# Patient Record
Sex: Female | Born: 1970 | Race: White | Hispanic: No | Marital: Single | Smoking: Never smoker
Health system: Southern US, Community
[De-identification: ages and names within clinical notes are randomized; demographics above are authoritative.]

## PROBLEM LIST (undated history)

## (undated) DIAGNOSIS — K219 Gastro-esophageal reflux disease without esophagitis: Secondary | ICD-10-CM

## (undated) DIAGNOSIS — Z87442 Personal history of urinary calculi: Secondary | ICD-10-CM

## (undated) DIAGNOSIS — E119 Type 2 diabetes mellitus without complications: Secondary | ICD-10-CM

## (undated) HISTORY — PX: EXTRACORPOREAL SHOCK WAVE LITHOTRIPSY: SHX1557

## (undated) HISTORY — DX: Gastro-esophageal reflux disease without esophagitis: K21.9

## (undated) HISTORY — DX: Type 2 diabetes mellitus without complications: E11.9

---

## 2007-08-25 HISTORY — PX: CHOLECYSTECTOMY: SHX55

## 2016-08-24 HISTORY — PX: OTHER SURGICAL HISTORY: SHX169

## 2017-07-22 ENCOUNTER — Other Ambulatory Visit (HOSPITAL_COMMUNITY): Payer: Self-pay | Admitting: General Surgery

## 2017-09-01 ENCOUNTER — Encounter: Payer: BC Managed Care – PPO | Attending: General Surgery | Admitting: Skilled Nursing Facility1

## 2017-09-01 ENCOUNTER — Encounter: Payer: Self-pay | Admitting: Skilled Nursing Facility1

## 2017-09-01 DIAGNOSIS — Z6841 Body Mass Index (BMI) 40.0 and over, adult: Secondary | ICD-10-CM | POA: Diagnosis not present

## 2017-09-01 DIAGNOSIS — E669 Obesity, unspecified: Secondary | ICD-10-CM

## 2017-09-01 DIAGNOSIS — Z713 Dietary counseling and surveillance: Secondary | ICD-10-CM | POA: Diagnosis present

## 2017-09-01 NOTE — Progress Notes (Signed)
Pre-Op Assessment Visit:  Pre-Operative RYGB Surgery  Medical Nutrition Therapy:  Appt start time: 4:51  End time:  5:45  Patient was seen on 09/01/2017 for Pre-Operative Nutrition Assessment. [facility] on 09/01/2017.   Pt states she has cut out soda due to it exacerbating her GERD symptoms.    Start weight at [facility]: 260.2 BMI: 42.00   24 hr Dietary Recall: First Meal: cereal Snack:  Second Meal: sandwich and chips or chicken pie and mashed potatoes and green beans Snack: chips and peanut butter crackers  Third Meal: cereal or meatloaf and mashed potatoes and green bean  Snack: chips and peanut butter crackers  Beverages: water, coffee  Encouraged to engage in 150 minutes of moderate physical activity including cardiovascular and weight baring weekly  Handouts given during visit include:  . Pre-Op Goals . Bariatric Surgery Protein Shakes During the appointment today the following Pre-Op Goals were reviewed with the patient: . Maintain or lose weight as instructed by your surgeon . Make healthy food choices . Begin to limit portion sizes . Limited concentrated sugars and fried foods . Keep fat/sugar in the single digits per serving on             food labels . Practice CHEWING your food  (aim for 30 chews per bite or until applesauce consistency) . Practice not drinking 15 minutes before, during, and 30 minutes after each meal/snack . Avoid all carbonated beverages  . Avoid/limit caffeinated beverages  . Avoid all sugar-sweetened beverages . Consume 3 meals per day; eat every 3-5 hours . Make a list of non-food related activities . Aim for 64-100 ounces of FLUID daily  . Aim for at least 60-80 grams of PROTEIN daily . Look for a liquid protein source that contain ?15 g protein and ?5 g carbohydrate  (ex: shakes, drinks, shots)  -Follow diet recommendations listed below   Energy  and Macronutrient Recomendations: Calories: 1600 Carbohydrate: 180 Protein: 120 Fat: 44  Demonstrated degree of understanding via:  Teach Back  Teaching Method Utilized: Visual Auditory Hands on Barriers to learning/adherence to lifestyle change: none identified  Patient to call the [facility] to enroll in Pre-Op and Post-Op Nutrition Education when surgery date is scheduled.

## 2017-10-05 ENCOUNTER — Encounter: Payer: BC Managed Care – PPO | Attending: General Surgery | Admitting: Registered"

## 2017-10-05 ENCOUNTER — Encounter: Payer: Self-pay | Admitting: Registered"

## 2017-10-05 DIAGNOSIS — E119 Type 2 diabetes mellitus without complications: Secondary | ICD-10-CM

## 2017-10-05 DIAGNOSIS — Z6841 Body Mass Index (BMI) 40.0 and over, adult: Secondary | ICD-10-CM | POA: Insufficient documentation

## 2017-10-05 DIAGNOSIS — Z713 Dietary counseling and surveillance: Secondary | ICD-10-CM | POA: Insufficient documentation

## 2017-10-05 NOTE — Progress Notes (Signed)
RYGB Appt start time: 2:35 end time: 2:56  Assessment: 1st SWL Appointment.   Start Wt at [facility]: 260.2 Wt: 257.6 BMI: 41.58   Pt arrives having lost 2.6 lbs from previous visit.   Pt states she has been trying to make healthy food choices. Pt states she likes sweets (cookies, cakes, etc). Pt states she has practiced not drinking with meals. Pt states she checks her BS every once in a while, reports last A1c was in Dec (6.1). Pt states she has had diabetes for about 1 year.   Per insurance visits, 3 SWL visits.   MEDICATIONS: See list   DIETARY INTAKE:  24-hr recall:  B ( AM): instant oatmeal   Snk ( AM): none  L ( PM): banana sandwich with mayo, low-fat cheetos Snk ( PM): none D ( PM): pork chops, macaroni and cheese with broccoli, baked beans Snk ( PM): pound cake Beverages: water, 1 c coffee  Usual physical activity: walking 30 min   Diet to Follow: 1600 calories 180 g carbohydrates 120 g protein 44 g fat  Preferred Learning Style:   No preference indicated   Learning Readiness:   Ready  Change in progress     Nutritional Diagnosis:  [place]-3.3 Overweight/obesity related to past poor dietary habits and physical inactivity as evidenced by patient w/ planned RYGB surgery following dietary guidelines for continued weight loss.    Intervention:  Nutrition counseling for upcoming Bariatric Surgery.  Goals:  - Aim for 150 minutes of physical activity including cardio and weight bearing every week - Aim to create a schedule for physical activity of at least 30 min, 3 days/week.  - Aim to check blood sugars fasting (80-130) and 2 hours (<180) after meals. Track numbers.  - Continue to not drink 15 minutes before eating, not while eating, and waiting 30 minutes after eating to drink.  - Try to pair carbohydrate sources with protein.   Teaching Method Utilized:  Visual Auditory Hands on  Handouts given during visit include:  Pre-op goals  Protein  Shakes  Vitamin and Mineral Recommendations  Barriers to learning/adherence to lifestyle change: none identified  Demonstrated degree of understanding via:  Teach Back   Monitoring/Evaluation:  Dietary intake, exercise, and body weight in 1 month(s).

## 2017-10-05 NOTE — Patient Instructions (Addendum)
-   Aim to create a schedule for physical activity of at least 30 min, 3 days/week.   - Aim to check blood sugars fasting (80-130) and 2 hours (<180) after meals. Track numbers.   - Continue to not drink 15 minutes before eating, not while eating, and waiting 30 minutes after eating to drink.   - Try to pair carbohydrate sources with protein.

## 2017-11-02 ENCOUNTER — Encounter: Payer: Self-pay | Admitting: Registered"

## 2017-11-02 ENCOUNTER — Encounter: Payer: BC Managed Care – PPO | Attending: General Surgery | Admitting: Registered"

## 2017-11-02 DIAGNOSIS — Z6841 Body Mass Index (BMI) 40.0 and over, adult: Secondary | ICD-10-CM | POA: Diagnosis not present

## 2017-11-02 DIAGNOSIS — Z713 Dietary counseling and surveillance: Secondary | ICD-10-CM | POA: Insufficient documentation

## 2017-11-02 DIAGNOSIS — E119 Type 2 diabetes mellitus without complications: Secondary | ICD-10-CM

## 2017-11-02 NOTE — Progress Notes (Signed)
RYGB Appt start time: 5:00 end time: 5:25  Assessment: 2nd SWL Appointment.   Start Wt at [facility]: 260.2 Wt: 259.1 BMI: 41.82   Pt arrives having gained about 1.5 lbs from previous visit.   Pt states she has been trying to make healthy food choices. Pt states she is still working on not drinking with meals. Pt states she is checking BS more often, 3-4x/week: FBS (130). Pt states she has increased physical activity with treadmill. Pt states she is drinking at least 64 ounces of fluid a day.     Per insurance visits, 3 SWL visits.   MEDICATIONS: See list   DIETARY INTAKE:  24-hr recall:  B ( AM): instant oatmeal   Snk ( AM): none  L ( PM): banana sandwich with mayo, low-fat cheetos Snk ( PM): none D ( PM): pork chops, macaroni and cheese with broccoli, baked beans Snk ( PM): pound cake Beverages: water, 1 c coffee  Usual physical activity: walking on treadmill 10-15 min, 3-4x/week  Diet to Follow: 1600 calories 180 g carbohydrates 120 g protein 44 g fat  Preferred Learning Style:   No preference indicated   Learning Readiness:   Ready  Change in progress     Nutritional Diagnosis:  [place]-3.3 Overweight/obesity related to past poor dietary habits and physical inactivity as evidenced by patient w/ planned RYGB surgery following dietary guidelines for continued weight loss.    Intervention:  Nutrition counseling for upcoming Bariatric Surgery.  Goals:  - Aim for 150 minutes of physical activity including cardio and weight bearing every week - Aim to check blood sugars fasting (80-130) and 2 hours (<180) after meals. Track numbers.  - Continue to not drink 15 minutes before eating, not while eating, and waiting 30 minutes after eating to drink.  - Aim to chew at least 30 times per bite or to applesauce consistency.  - Look into attending support group.    Teaching Method Utilized:  Visual Auditory Hands on  Handouts given during visit include:  none  Barriers  to learning/adherence to lifestyle change: none identified  Demonstrated degree of understanding via:  Teach Back   Monitoring/Evaluation:  Dietary intake, exercise, and body weight in 1 month(s).

## 2017-11-02 NOTE — Patient Instructions (Addendum)
-   Aim to check blood sugars fasting (80-130) and 2 hours (<180) after meals. Track numbers.   - Continue to not drink 15 minutes before eating, not while eating, and waiting 30 minutes after eating to drink.   - Aim to chew at least 30 times per bite or to applesauce consistency.   - Look into attending support group.

## 2017-12-02 ENCOUNTER — Encounter: Payer: BC Managed Care – PPO | Attending: General Surgery | Admitting: Registered"

## 2017-12-02 ENCOUNTER — Encounter: Payer: Self-pay | Admitting: Registered"

## 2017-12-02 DIAGNOSIS — E119 Type 2 diabetes mellitus without complications: Secondary | ICD-10-CM

## 2017-12-02 DIAGNOSIS — Z713 Dietary counseling and surveillance: Secondary | ICD-10-CM | POA: Diagnosis present

## 2017-12-02 DIAGNOSIS — Z6841 Body Mass Index (BMI) 40.0 and over, adult: Secondary | ICD-10-CM | POA: Insufficient documentation

## 2017-12-02 NOTE — Patient Instructions (Addendum)
-   Aim to check blood sugars fasting (80-130) and 2 hours (<180) after meals. Track numbers.   - Aim to increase as physical activity with treadmill 2x/week and walking dog outside 2x/wk at least 15 min each.   - Check out support on Thurs, 4/18 from 6-7 pm. See handout.   - Aim to decrease caffeine intake.

## 2017-12-02 NOTE — Progress Notes (Signed)
RYGB Appt start time: 5:00 end time: 5:25  Assessment: 3rd SWL Appointment.   Start Wt at [facility]: 260.2 Wt: 252.8 BMI: 40.80   Pt arrives having lost 6.3 lbs from previous visit.   Pt states she has been trying to make healthy food choices. Pt states she is doing well with not drinking 15 minutes before eating, not while eating, and waiting 30 minutes after eating to drink. Pt states she is checking BS 3x/week: FBS (125-140). Pt states she keeps forgetting to check BS during the day. Pt states chewing 30 times per bite has improved for her. Pt states she can tolerate Premier Protein: chocolate and bananas and cream flavors.   Pt states she has increased physical activity with treadmill. Pt states she is drinking at least 64 ounces of fluid a day.     Per insurance visits, 3 SWL visits.   MEDICATIONS: See list   DIETARY INTAKE:  24-hr recall:  B ( AM): cereal    Snk ( AM): none  L ( PM): banana sandwich with peanut butter, low-fat cheetos, yogurt Snk ( PM): none D ( PM): pork chop, green beans, and rice/broccoli mix Snk ( PM): none Beverages: water, 1 c coffee, skim milk  Usual physical activity: walking on treadmill 15 min, 2x/week  Diet to Follow: 1600 calories 180 g carbohydrates 120 g protein 44 g fat  Preferred Learning Style:   No preference indicated   Learning Readiness:   Ready  Change in progress     Nutritional Diagnosis:  [place]-3.3 Overweight/obesity related to past poor dietary habits and physical inactivity as evidenced by patient w/ planned RYGB surgery following dietary guidelines for continued weight loss.    Intervention:  Nutrition counseling for upcoming Bariatric Surgery.  Goals:  - Aim for 150 minutes of physical activity including cardio and weight bearing every week - Aim to check blood sugars fasting (80-130) and 2 hours (<180) after meals. Track numbers.  - Aim to increase as physical activity with treadmill 2x/week and walking dog  outside 2x/wk at least 15 min each.  - Check out support on Thurs, 4/18 from 6-7 pm. See handout.  - Aim to decrease caffeine intake.     Teaching Method Utilized:  Visual Auditory Hands on  Handouts given during visit include:  Bariatric Support Group  Barriers to learning/adherence to lifestyle change: none identified  Demonstrated degree of understanding via:  Teach Back   Monitoring/Evaluation:  Dietary intake, exercise, and body weight prn.

## 2017-12-06 ENCOUNTER — Other Ambulatory Visit (HOSPITAL_COMMUNITY)
Admission: RE | Admit: 2017-12-06 | Discharge: 2017-12-06 | Disposition: A | Discharge status: Home / Self Care | Payer: BC Managed Care – PPO | Source: Other Acute Inpatient Hospital | Attending: General Surgery | Admitting: General Surgery

## 2017-12-06 ENCOUNTER — Ambulatory Visit (HOSPITAL_COMMUNITY)
Admission: RE | Admit: 2017-12-06 | Discharge: 2017-12-06 | Disposition: A | Discharge status: Home / Self Care | Payer: BC Managed Care – PPO | Source: Ambulatory Visit | Attending: General Surgery | Admitting: General Surgery

## 2017-12-06 ENCOUNTER — Encounter (HOSPITAL_COMMUNITY): Payer: Self-pay | Admitting: Radiology

## 2017-12-06 LAB — LIPID PANEL
Cholesterol: 179 mg/dL (ref 0–200)
HDL: 51 mg/dL (ref >40)
LDL CALC: 111 mg/dL — AB (ref 0–99)
Total CHOL/HDL Ratio: 3.5 RATIO
Triglycerides: 86 mg/dL (ref <150)
VLDL: 17 mg/dL (ref 0–40)

## 2017-12-06 LAB — TSH: TSH: 1.198 u[IU]/mL (ref 0.350–4.500)

## 2017-12-06 LAB — HCG, SERUM, QUALITATIVE: PREG SERUM: POSITIVE — AB

## 2017-12-06 LAB — COMPREHENSIVE METABOLIC PANEL
ALBUMIN: 4.1 g/dL (ref 3.5–5.0)
ALK PHOS: 96 U/L (ref 38–126)
ALT: 50 U/L (ref 14–54)
AST: 41 U/L (ref 15–41)
Anion gap: 12 (ref 5–15)
BUN: 9 mg/dL (ref 6–20)
CHLORIDE: 103 mmol/L (ref 101–111)
CO2: 24 mmol/L (ref 22–32)
CREATININE: 0.7 mg/dL (ref 0.44–1.00)
Calcium: 9.4 mg/dL (ref 8.9–10.3)
GFR calc non Af Amer: >60 mL/min (ref >60)
Glucose, Bld: 120 mg/dL — ABNORMAL HIGH (ref 65–99)
Potassium: 4 mmol/L (ref 3.5–5.1)
Sodium: 139 mmol/L (ref 135–145)
Total Bilirubin: 0.5 mg/dL (ref 0.3–1.2)
Total Protein: 8.3 g/dL — ABNORMAL HIGH (ref 6.5–8.1)

## 2017-12-06 LAB — FOLATE: FOLATE: 18.4 ng/mL (ref >5.9)

## 2017-12-06 LAB — HEMOGLOBIN A1C
HEMOGLOBIN A1C: 6.3 % — AB (ref 4.8–5.6)
Mean Plasma Glucose: 134.11 mg/dL

## 2017-12-06 LAB — IRON: IRON: 62 ug/dL (ref 28–170)

## 2017-12-06 LAB — VITAMIN B12: VITAMIN B 12: 541 pg/mL (ref 180–914)

## 2017-12-07 LAB — VITAMIN D 25 HYDROXY (VIT D DEFICIENCY, FRACTURES): Vit D, 25-Hydroxy: 29.9 ng/mL — ABNORMAL LOW (ref 30.0–100.0)

## 2017-12-08 LAB — VITAMIN B1: Vitamin B1 (Thiamine): 197.1 nmol/L (ref 66.5–200.0)

## 2017-12-10 LAB — VITAMIN D 1,25 DIHYDROXY
VITAMIN D3 1, 25 (OH): 59 pg/mL
Vitamin D 1, 25 (OH)2 Total: 60 pg/mL

## 2018-01-03 ENCOUNTER — Encounter: Payer: BC Managed Care – PPO | Attending: General Surgery | Admitting: Skilled Nursing Facility1

## 2018-01-03 DIAGNOSIS — Z6841 Body Mass Index (BMI) 40.0 and over, adult: Secondary | ICD-10-CM | POA: Diagnosis not present

## 2018-01-03 DIAGNOSIS — E669 Obesity, unspecified: Secondary | ICD-10-CM

## 2018-01-03 DIAGNOSIS — Z713 Dietary counseling and surveillance: Secondary | ICD-10-CM | POA: Diagnosis not present

## 2018-01-04 ENCOUNTER — Encounter: Payer: Self-pay | Admitting: Skilled Nursing Facility1

## 2018-01-04 NOTE — Progress Notes (Signed)
Pre-Operative Nutrition Class:  Appt start time: 8727   End time:  1830.  Patient was seen on 01/03/2018 for Pre-Operative Bariatric Surgery Education [facility].   Surgery date:  Surgery type: RYGB Start weight at [facility]: 260.2 Weight today: 258.6  Samples given per MNT protocol. Patient educated on appropriate usage: Bariatric Advantage Multivitamin Lot #[id number] Exp:07/19  Bariatric Advantage Calcium  Lot #[id number] Exp:sep-03-2018  Renee Pain Protein Shake Lot #[id number] Exp:oct-19-19    The following the learning objectives were met by the patient during this course:  Identify Pre-Op Dietary Goals and will begin 2 weeks pre-operatively  Identify appropriate sources of fluids and proteins   State protein recommendations and appropriate sources pre and post-operatively  Identify Post-Operative Dietary Goals and will follow for 2 weeks post-operatively  Identify appropriate multivitamin and calcium sources  Describe the need for physical activity post-operatively and will follow MD recommendations  State when to call healthcare provider regarding medication questions or post-operative complications  Handouts given during class include:  Pre-Op Bariatric Surgery Diet Handout  Protein Shake Handout  Post-Op Bariatric Surgery Nutrition Handout  [facility] Program Information Flyer  Support Group Information Flyer  [facility] Bariatric Supplements Price List  [facility]: [facility].

## 2018-02-02 ENCOUNTER — Ambulatory Visit: Payer: Self-pay | Admitting: General Surgery

## 2018-02-09 NOTE — Patient Instructions (Signed)
Chelsea Mitchell  02/09/2018   Your procedure is scheduled on: 02-16-18   Report to [facility] Main  Entrance  Report to admitting at 530 AM    Call this number if you have problems the morning of surgery [phone]   Remember: Do not eat food  :After 600 PM NIGHT BEFORE SURGERY DRINK 2 SHAKES AT 1000 PM NIGHT BEFORE SURGERY.   MORNING OF SURGERY DRINK:  LAST SHAKE BEFORE YOU LEAVE HOME, DRINK ALL OF THE SHAKE AT ONE TIME.   NO SOLID FOOD AFTER 600 PM THE NIGHT BEFORE YOUR SURGERY. YOU MAY DRINK CLEAR FLUIDS. THE SHAKE YOU DRINK BEFORE YOU LEAVE HOME WILL BE THE LAST FLUIDS YOU DRINK BEFORE SURGERY.DRINK LAST SHAKE AT 430 AM.   PAIN IS EXPECTED AFTER SURGERY AND WILL NOT BE COMPLETELY ELIMINATED. AMBULATION AND TYLENOL WILL HELP REDUCE INCISIONAL AND GAS PAIN. MOVEMENT IS KEY!  YOU ARE EXPECTED TO BE OUT OF BED WITHIN 4 HOURS OF ADMISSION TO YOUR PATIENT ROOM.  SITTING IN THE RECLINER THROUGHOUT THE DAY IS IMPORTANT FOR DRINKING FLUIDS AND MOVING GAS THROUGHOUT THE GI TRACT.  COMPRESSION STOCKINGS SHOULD BE WORN [facility] UNLESS YOU ARE WALKING.   INCENTIVE SPIROMETER SHOULD BE USED EVERY HOUR WHILE AWAKE TO DECREASE POST-OPERATIVE COMPLICATIONS SUCH AS PNEUMONIA.  WHEN DISCHARGED HOME, IT IS IMPORTANT TO CONTINUE TO WALK EVERY HOUR AND USE THE INCENTIVE SPIROMETER EVERY HOUR.     CLEAR LIQUID DIET   Foods Allowed                                                                     Foods Excluded  Coffee and tea, regular and decaf                             liquids that you cannot  Plain Jell-O in any flavor                                             see through such as: Fruit ices (not with fruit pulp)                                     milk, soups, orange juice  Iced Popsicles                                    All solid food Carbonated beverages, regular and diet                                    Cranberry, grape and apple  juices Sports drinks like Gatorade Lightly seasoned clear broth or consume(fat free) Sugar, honey syrup  Sample Menu Breakfast  Lunch                                     Supper Cranberry juice                    Beef broth                            Chicken broth Jell-O                                     Grape juice                           Apple juice Coffee or tea                        Jell-O                                      Popsicle                                                Coffee or tea                        Coffee or tea  _____________________________________________________________________           Take these medicines the morning of surgery with A SIP OF WATER: PANTAPRAZOLE (PROTONIX) DO NOT TAKE ANY DIABETIC MEDICATIONS DAY OF YOUR SURGERY                 YOU MAY TAKE YOUR METFORMIN DAY BEFORE SURGERY 02-14-18                  DO NOT TAKE YOUR METFORMIN DAY OF SURGERY 02-15-18!                You may not have any metal on your body including hair pins and              piercings  Do not wear jewelry, make-up, lotions, powders or perfumes, deodorant             Do not wear nail polish.  Do not shave  48 hours prior to surgery.              Men may shave face and neck.   Do not bring valuables to the hospital. [place] IS NOT             RESPONSIBLE   FOR VALUABLES.  Contacts, dentures or bridgework may not be worn into surgery.  Leave suitcase in the car. After surgery it may be brought to your room.                 Please read over the following fact sheets you were given: _____________________________________________________________________  [facility] - Preparing for Surgery Before surgery, you can play an important role.  Because skin is not sterile, your skin needs to be as free of germs as possible.  You can reduce the number of germs on your skin by washing with CHG (chlorahexidine gluconate) soap before surgery.  CHG  is an antiseptic cleaner which kills germs and bonds with the skin to continue killing germs even after washing. Please DO NOT use if you have an allergy to CHG or antibacterial soaps.  If your skin becomes reddened/irritated stop using the CHG and inform your nurse when you arrive at Short Stay. Do not shave (including legs and underarms) for at least 48 hours prior to the first CHG shower.  You may shave your face/neck. Please follow these instructions carefully:  1.  Shower with CHG Soap the night before surgery and the  morning of Surgery.  2.  If you choose to wash your hair, wash your hair first as usual with your  normal  shampoo.  3.  After you shampoo, rinse your hair and body thoroughly to remove the  shampoo.                           4.  Use CHG as you would any other liquid soap.  You can apply chg directly  to the skin and wash                       Gently with a scrungie or clean washcloth.  5.  Apply the CHG Soap to your body ONLY FROM THE NECK DOWN.   Do not use on face/ open                           Wound or open sores. Avoid contact with eyes, ears mouth and genitals (private parts).                       Wash face,  Genitals (private parts) with your normal soap.             6.  Wash thoroughly, paying special attention to the area where your surgery  will be performed.  7.  Thoroughly rinse your body with warm water from the neck down.  8.  DO NOT shower/wash with your normal soap after using and rinsing off  the CHG Soap.                9.  Pat yourself dry with a clean towel.            10.  Wear clean pajamas.            11.  Place clean sheets on your bed the night of your first shower and do not  sleep with pets. Day of Surgery : Do not apply any lotions/deodorants the morning of surgery.  Please wear clean clothes to the hospital/surgery center.  FAILURE TO FOLLOW THESE INSTRUCTIONS MAY RESULT IN THE CANCELLATION OF YOUR SURGERY PATIENT  SIGNATURE_________________________________  NURSE SIGNATURE__________________________________  ________________________________________________________________________   Chelsea Mitchell  An incentive spirometer is a tool that can help keep your lungs clear and active. This tool measures how well you are filling your lungs with each breath. Taking long deep breaths may help reverse or decrease the chance of developing breathing (pulmonary) problems (especially infection) following:  A long period of time when you are unable to move or be active. BEFORE THE PROCEDURE   If the spirometer includes an indicator to show your best effort, your nurse or respiratory therapist will  set it to a desired goal.  If possible, sit up straight or lean slightly forward. Try not to slouch.  Hold the incentive spirometer in an upright position. INSTRUCTIONS FOR USE  1. Sit on the edge of your bed if possible, or sit up as far as you can in bed or on a chair. 2. Hold the incentive spirometer in an upright position. 3. Breathe out normally. 4. Place the mouthpiece in your mouth and seal your lips tightly around it. 5. Breathe in slowly and as deeply as possible, raising the piston or the ball toward the top of the column. 6. Hold your breath for 3-5 seconds or for as long as possible. Allow the piston or ball to fall to the bottom of the column. 7. Remove the mouthpiece from your mouth and breathe out normally. 8. Rest for a few seconds and repeat Steps 1 through 7 at least 10 times every 1-2 hours when you are awake. Take your time and take a few normal breaths between deep breaths. 9. The spirometer may include an indicator to show your best effort. Use the indicator as a goal to work toward during each repetition. 10. After each set of 10 deep breaths, practice coughing to be sure your lungs are clear. If you have an incision (the cut made at the time of surgery), support your incision when coughing  by placing a pillow or rolled up towels firmly against it. Once you are able to get out of bed, walk around indoors and cough well. You may stop using the incentive spirometer when instructed by your caregiver.  RISKS AND COMPLICATIONS  Take your time so you do not get dizzy or light-headed.  If you are in pain, you may need to take or ask for pain medication before doing incentive spirometry. It is harder to take a deep breath if you are having pain. AFTER USE  Rest and breathe slowly and easily.  It can be helpful to keep track of a log of your progress. Your caregiver can provide you with a simple table to help with this. If you are using the spirometer at home, follow these instructions: SEEK MEDICAL CARE IF:   You are having difficultly using the spirometer.  You have trouble using the spirometer as often as instructed.  Your pain medication is not giving enough relief while using the spirometer.  You develop fever of 100.5 F (38.1 C) or higher. SEEK IMMEDIATE MEDICAL CARE IF:   You cough up bloody sputum that had not been present before.  You develop fever of 102 F (38.9 C) or greater.  You develop worsening pain at or near the incision site. MAKE SURE YOU:   Understand these instructions.  Will watch your condition.  Will get help right away if you are not doing well or get worse. Document Released: 12/21/2006 Document Revised: 11/02/2011 Document Reviewed: 02/21/2007 [employer] Patient Information 2014 Exit[employer], [place].   ________________________________________________________________________  WHAT IS A BLOOD TRANSFUSION? Blood Transfusion Information  A transfusion is the replacement of blood or some of its parts. Blood is made up of multiple cells which provide different functions.  Red blood cells carry oxygen and are used for blood loss replacement.  White blood cells fight against infection.  Platelets control bleeding.  Plasma helps clot  blood.  Other blood products are available for specialized needs, such as hemophilia or other clotting disorders. BEFORE THE TRANSFUSION  Who gives blood for transfusions?   Healthy volunteers who are fully  evaluated to make sure their blood is safe. This is blood bank blood. Transfusion therapy is the safest it has ever been in the practice of medicine. Before blood is taken from a donor, a complete history is taken to make sure that person has no history of diseases nor engages in risky social behavior (examples are intravenous drug use or sexual activity with multiple partners). The donor's travel history is screened to minimize risk of transmitting infections, such as malaria. The donated blood is tested for signs of infectious diseases, such as HIV and hepatitis. The blood is then tested to be sure it is compatible with you in order to minimize the chance of a transfusion reaction. If you or a relative donates blood, this is often done in anticipation of surgery and is not appropriate for emergency situations. It takes many days to process the donated blood. RISKS AND COMPLICATIONS Although transfusion therapy is very safe and saves many lives, the main dangers of transfusion include:   Getting an infectious disease.  Developing a transfusion reaction. This is an allergic reaction to something in the blood you were given. Every precaution is taken to prevent this. The decision to have a blood transfusion has been considered carefully by your caregiver before blood is given. Blood is not given unless the benefits outweigh the risks. AFTER THE TRANSFUSION  Right after receiving a blood transfusion, you will usually feel much better and more energetic. This is especially true if your red blood cells have gotten low (anemic). The transfusion raises the level of the red blood cells which carry oxygen, and this usually causes an energy increase.  The nurse administering the transfusion will monitor  you carefully for complications. HOME CARE INSTRUCTIONS  No special instructions are needed after a transfusion. You may find your energy is better. Speak with your caregiver about any limitations on activity for underlying diseases you may have. SEEK MEDICAL CARE IF:   Your condition is not improving after your transfusion.  You develop redness or irritation at the intravenous (IV) site. SEEK IMMEDIATE MEDICAL CARE IF:  Any of the following symptoms occur over the next 12 hours:  Shaking chills.  You have a temperature by mouth above 102 F (38.9 C), not controlled by medicine.  Chest, back, or muscle pain.  People around you feel you are not acting correctly or are confused.  Shortness of breath or difficulty breathing.  Dizziness and fainting.  You get a rash or develop hives.  You have a decrease in urine output.  Your urine turns a dark color or changes to pink, red, or brown. Any of the following symptoms occur over the next 10 days:  You have a temperature by mouth above 102 F (38.9 C), not controlled by medicine.  Shortness of breath.  Weakness after normal activity.  The white part of the eye turns yellow (jaundice).  You have a decrease in the amount of urine or are urinating less often.  Your urine turns a dark color or changes to pink, red, or brown. Document Released: 08/07/2000 Document Revised: 11/02/2011 Document Reviewed: 03/26/2008 [employer] Patient Information 2014 [place], [place].  _______________________________________________________________________

## 2018-02-10 ENCOUNTER — Encounter (HOSPITAL_COMMUNITY)
Admission: RE | Admit: 2018-02-10 | Discharge: 2018-02-10 | Disposition: A | Discharge status: Home / Self Care | Payer: BC Managed Care – PPO | Source: Ambulatory Visit | Attending: General Surgery | Admitting: General Surgery

## 2018-02-10 ENCOUNTER — Other Ambulatory Visit: Payer: Self-pay

## 2018-02-10 ENCOUNTER — Encounter (HOSPITAL_COMMUNITY): Payer: Self-pay

## 2018-02-10 DIAGNOSIS — R9431 Abnormal electrocardiogram [ECG] [EKG]: Secondary | ICD-10-CM | POA: Diagnosis not present

## 2018-02-10 DIAGNOSIS — Z01810 Encounter for preprocedural cardiovascular examination: Secondary | ICD-10-CM | POA: Diagnosis present

## 2018-02-10 HISTORY — DX: Morbid (severe) obesity due to excess calories: E66.01

## 2018-02-10 HISTORY — DX: Personal history of urinary calculi: Z87.442

## 2018-02-10 LAB — CBC WITH DIFFERENTIAL/PLATELET
BASOS PCT: 1 %
Basophils Absolute: 0.1 10*3/uL (ref 0.0–0.1)
EOS ABS: 0.2 10*3/uL (ref 0.0–0.7)
Eosinophils Relative: 3 %
HEMATOCRIT: 41.1 % (ref 36.0–46.0)
HEMOGLOBIN: 13.7 g/dL (ref 12.0–15.0)
Lymphocytes Relative: 39 %
Lymphs Abs: 3 10*3/uL (ref 0.7–4.0)
MCH: 29.8 pg (ref 26.0–34.0)
MCHC: 33.3 g/dL (ref 30.0–36.0)
MCV: 89.3 fL (ref 78.0–100.0)
Monocytes Absolute: 0.8 10*3/uL (ref 0.1–1.0)
Monocytes Relative: 10 %
NEUTROS ABS: 3.7 10*3/uL (ref 1.7–7.7)
NEUTROS PCT: 47 %
Platelets: 249 10*3/uL (ref 150–400)
RBC: 4.6 MIL/uL (ref 3.87–5.11)
RDW: 13.3 % (ref 11.5–15.5)
WBC: 7.8 10*3/uL (ref 4.0–10.5)

## 2018-02-10 LAB — COMPREHENSIVE METABOLIC PANEL
ALBUMIN: 3.8 g/dL (ref 3.5–5.0)
ALT: 63 U/L — AB (ref 14–54)
ANION GAP: 8 (ref 5–15)
AST: 57 U/L — AB (ref 15–41)
Alkaline Phosphatase: 92 U/L (ref 38–126)
BILIRUBIN TOTAL: 0.2 mg/dL — AB (ref 0.3–1.2)
BUN: 10 mg/dL (ref 6–20)
CO2: 24 mmol/L (ref 22–32)
CREATININE: 0.56 mg/dL (ref 0.44–1.00)
Calcium: 9.4 mg/dL (ref 8.9–10.3)
Chloride: 109 mmol/L (ref 101–111)
GFR calc Af Amer: >60 mL/min (ref >60)
GFR calc non Af Amer: >60 mL/min (ref >60)
Glucose, Bld: 162 mg/dL — ABNORMAL HIGH (ref 65–99)
Potassium: 4.3 mmol/L (ref 3.5–5.1)
SODIUM: 141 mmol/L (ref 135–145)
TOTAL PROTEIN: 7.3 g/dL (ref 6.5–8.1)

## 2018-02-10 LAB — GLUCOSE, CAPILLARY: Glucose-Capillary: 210 mg/dL — ABNORMAL HIGH (ref 65–99)

## 2018-02-10 LAB — ABO/RH: ABO/RH(D): O POS

## 2018-02-10 LAB — HEMOGLOBIN A1C
Hgb A1c MFr Bld: 7.2 % — ABNORMAL HIGH (ref 4.8–5.6)
MEAN PLASMA GLUCOSE: 159.94 mg/dL

## 2018-02-10 NOTE — Progress Notes (Signed)
Chart left with ihechi ewareum for follow of ehg and hemaglobin A1C.

## 2018-02-10 NOTE — Progress Notes (Signed)
Abnormal ekg [facility] 09-23-17 on chart will repeat with pre op today

## 2018-02-14 MED ORDER — BUPIVACAINE LIPOSOME 1.3 % IJ SUSP
20.0000 mL | INTRAMUSCULAR | Status: DC
  Filled 2018-02-14: qty 20

## 2018-02-14 NOTE — Anesthesia Preprocedure Evaluation (Addendum)
Anesthesia Evaluation  Patient identified by MRN, date of birth, ID band Patient awake    Reviewed: Allergy & Precautions, H&P , NPO status , Patient's Chart, lab work & pertinent test results  Airway Mallampati: I  TM Distance: >3 FB Neck ROM: Full    Dental no notable dental hx. (+) Teeth Intact, Dental Advisory Given   Pulmonary neg pulmonary ROS,    Pulmonary exam normal breath sounds clear to auscultation       Cardiovascular Exercise Tolerance: Good negative cardio ROS   Rhythm:Regular Rate:Normal     Neuro/Psych negative neurological ROS  negative psych ROS   GI/Hepatic Neg liver ROS, GERD  ,  Endo/Other  diabetes, Type 2, Oral Hypoglycemic AgentsMorbid obesity  Renal/GU negative Renal ROS  negative genitourinary   Musculoskeletal   Abdominal   Peds  Hematology negative hematology ROS (+)   Anesthesia Other Findings   Reproductive/Obstetrics negative OB ROS                            Anesthesia Physical Anesthesia Plan  ASA: III  Anesthesia Plan: General   Post-op Pain Management:    Induction: Intravenous  PONV Risk Score and Plan: 4 or greater and Ondansetron, Dexamethasone, Midazolam and Scopolamine patch - Pre-op  Airway Management Planned: Oral ETT  Additional Equipment:   Intra-op Plan:   Post-operative Plan: Extubation in OR  Informed Consent: I have reviewed the patients History and Physical, chart, labs and discussed the procedure including the risks, benefits and alternatives for the proposed anesthesia with the patient or authorized representative who has indicated his/her understanding and acceptance.   Dental advisory given  Plan Discussed with: CRNA  Anesthesia Plan Comments:         Anesthesia Quick Evaluation

## 2018-02-15 ENCOUNTER — Other Ambulatory Visit: Payer: Self-pay

## 2018-02-15 ENCOUNTER — Inpatient Hospital Stay (HOSPITAL_COMMUNITY): Payer: BC Managed Care – PPO | Admitting: Anesthesiology

## 2018-02-15 ENCOUNTER — Inpatient Hospital Stay (HOSPITAL_COMMUNITY)
Admission: RE | Admit: 2018-02-15 | Discharge: 2018-02-16 | DRG: 621 | Disposition: A | Discharge status: Home / Self Care | Payer: BC Managed Care – PPO | Attending: General Surgery | Admitting: General Surgery

## 2018-02-15 ENCOUNTER — Encounter (HOSPITAL_COMMUNITY): Payer: Self-pay | Admitting: General Surgery

## 2018-02-15 ENCOUNTER — Encounter (HOSPITAL_COMMUNITY)
Admission: RE | Disposition: A | Discharge status: Home / Self Care | Payer: Self-pay | Source: Home / Self Care | Attending: General Surgery

## 2018-02-15 DIAGNOSIS — E119 Type 2 diabetes mellitus without complications: Secondary | ICD-10-CM | POA: Diagnosis present

## 2018-02-15 DIAGNOSIS — K219 Gastro-esophageal reflux disease without esophagitis: Secondary | ICD-10-CM | POA: Diagnosis present

## 2018-02-15 DIAGNOSIS — Z6841 Body Mass Index (BMI) 40.0 and over, adult: Secondary | ICD-10-CM

## 2018-02-15 DIAGNOSIS — Z9049 Acquired absence of other specified parts of digestive tract: Secondary | ICD-10-CM

## 2018-02-15 DIAGNOSIS — K760 Fatty (change of) liver, not elsewhere classified: Secondary | ICD-10-CM | POA: Diagnosis present

## 2018-02-15 DIAGNOSIS — G8929 Other chronic pain: Secondary | ICD-10-CM | POA: Diagnosis present

## 2018-02-15 DIAGNOSIS — Z8711 Personal history of peptic ulcer disease: Secondary | ICD-10-CM

## 2018-02-15 DIAGNOSIS — E1169 Type 2 diabetes mellitus with other specified complication: Secondary | ICD-10-CM | POA: Diagnosis present

## 2018-02-15 DIAGNOSIS — E669 Obesity, unspecified: Secondary | ICD-10-CM

## 2018-02-15 DIAGNOSIS — Z9884 Bariatric surgery status: Secondary | ICD-10-CM

## 2018-02-15 HISTORY — PX: UPPER GI ENDOSCOPY: SHX6162

## 2018-02-15 HISTORY — PX: GASTRIC ROUX-EN-Y: SHX5262

## 2018-02-15 LAB — TYPE AND SCREEN
ABO/RH(D): O POS
Antibody Screen: NEGATIVE

## 2018-02-15 LAB — HEMOGLOBIN AND HEMATOCRIT, BLOOD
HEMATOCRIT: 40.9 % (ref 36.0–46.0)
Hemoglobin: 13.5 g/dL (ref 12.0–15.0)

## 2018-02-15 LAB — GLUCOSE, CAPILLARY
Glucose-Capillary: 148 mg/dL — ABNORMAL HIGH (ref 70–99)
Glucose-Capillary: 166 mg/dL — ABNORMAL HIGH (ref 70–99)
Glucose-Capillary: 189 mg/dL — ABNORMAL HIGH (ref 70–99)
Glucose-Capillary: 211 mg/dL — ABNORMAL HIGH (ref 70–99)
Glucose-Capillary: 239 mg/dL — ABNORMAL HIGH (ref 70–99)

## 2018-02-15 LAB — PREGNANCY, URINE: Preg Test, Ur: NEGATIVE

## 2018-02-15 SURGERY — LAPAROSCOPIC ROUX-EN-Y GASTRIC BYPASS WITH UPPER ENDOSCOPY
Anesthesia: General | Site: Abdomen

## 2018-02-15 MED ORDER — ONDANSETRON HCL 4 MG/2ML IJ SOLN: 4.0000 mg | Freq: Four times a day (QID) | INTRAMUSCULAR | Status: DC | PRN

## 2018-02-15 MED ORDER — PHENYLEPHRINE 40 MCG/ML (10ML) SYRINGE FOR IV PUSH (FOR BLOOD PRESSURE SUPPORT)
PREFILLED_SYRINGE | INTRAVENOUS | Status: AC
  Filled 2018-02-15: qty 10

## 2018-02-15 MED ORDER — KETAMINE HCL 10 MG/ML IJ SOLN
INTRAMUSCULAR | Status: DC | PRN
  Administered 2018-02-15: 30 mg via INTRAVENOUS
  Administered 2018-02-15: 20 mg via INTRAVENOUS

## 2018-02-15 MED ORDER — INSULIN ASPART 100 UNIT/ML ~~LOC~~ SOLN
0.0000 [IU] | SUBCUTANEOUS | Status: DC
  Administered 2018-02-15: 3 [IU] via SUBCUTANEOUS
  Administered 2018-02-15: 2 [IU] via SUBCUTANEOUS
  Administered 2018-02-15: 3 [IU] via SUBCUTANEOUS
  Administered 2018-02-16: 2 [IU] via SUBCUTANEOUS
  Administered 2018-02-16: 3 [IU] via SUBCUTANEOUS
  Administered 2018-02-16: 2 [IU] via SUBCUTANEOUS

## 2018-02-15 MED ORDER — GABAPENTIN 300 MG PO CAPS
300.0000 mg | ORAL_CAPSULE | ORAL | Status: AC
  Administered 2018-02-15: 300 mg via ORAL
  Filled 2018-02-15: qty 1

## 2018-02-15 MED ORDER — OXYCODONE HCL 5 MG/5ML PO SOLN
5.0000 mg | ORAL | Status: DC | PRN
  Administered 2018-02-15 – 2018-02-16 (×3): 5 mg via ORAL
  Filled 2018-02-15 (×3): qty 5

## 2018-02-15 MED ORDER — ONDANSETRON HCL 4 MG/2ML IJ SOLN
INTRAMUSCULAR | Status: DC | PRN
  Administered 2018-02-15: 4 mg via INTRAVENOUS

## 2018-02-15 MED ORDER — DIPHENHYDRAMINE HCL 50 MG/ML IJ SOLN: 12.5000 mg | Freq: Three times a day (TID) | INTRAMUSCULAR | Status: DC | PRN

## 2018-02-15 MED ORDER — ACETAMINOPHEN 500 MG PO TABS
1000.0000 mg | ORAL_TABLET | ORAL | Status: AC
  Administered 2018-02-15: 1000 mg via ORAL
  Filled 2018-02-15: qty 2

## 2018-02-15 MED ORDER — SUCCINYLCHOLINE CHLORIDE 200 MG/10ML IV SOSY
PREFILLED_SYRINGE | INTRAVENOUS | Status: DC | PRN
  Administered 2018-02-15: 120 mg via INTRAVENOUS

## 2018-02-15 MED ORDER — MORPHINE SULFATE (PF) 2 MG/ML IV SOLN
1.0000 mg | INTRAVENOUS | Status: DC | PRN
  Administered 2018-02-15: 1 mg via INTRAVENOUS
  Filled 2018-02-15: qty 1

## 2018-02-15 MED ORDER — DEXAMETHASONE SODIUM PHOSPHATE 4 MG/ML IJ SOLN
4.0000 mg | INTRAMUSCULAR | Status: AC
  Administered 2018-02-15: 4 mg via INTRAVENOUS

## 2018-02-15 MED ORDER — EVICEL 5 ML EX KIT
PACK | Freq: Once | CUTANEOUS | Status: DC
  Filled 2018-02-15: qty 1

## 2018-02-15 MED ORDER — APREPITANT 40 MG PO CAPS
40.0000 mg | ORAL_CAPSULE | ORAL | Status: AC
  Administered 2018-02-15: 40 mg via ORAL
  Filled 2018-02-15: qty 1

## 2018-02-15 MED ORDER — SCOPOLAMINE 1 MG/3DAYS TD PT72
1.0000 | MEDICATED_PATCH | TRANSDERMAL | Status: DC
  Administered 2018-02-15: 1.5 mg via TRANSDERMAL
  Filled 2018-02-15: qty 1

## 2018-02-15 MED ORDER — FAMOTIDINE IN NACL 20-0.9 MG/50ML-% IV SOLN
20.0000 mg | Freq: Two times a day (BID) | INTRAVENOUS | Status: DC
  Administered 2018-02-15 – 2018-02-16 (×3): 20 mg via INTRAVENOUS
  Filled 2018-02-15 (×3): qty 50

## 2018-02-15 MED ORDER — BUPIVACAINE LIPOSOME 1.3 % IJ SUSP: 20.0000 mL | Freq: Once | INTRAMUSCULAR | Status: DC

## 2018-02-15 MED ORDER — PROPOFOL 10 MG/ML IV BOLUS
INTRAVENOUS | Status: AC
  Filled 2018-02-15: qty 20

## 2018-02-15 MED ORDER — LIDOCAINE 2% (20 MG/ML) 5 ML SYRINGE
INTRAMUSCULAR | Status: DC | PRN
  Administered 2018-02-15: 100 mg via INTRAVENOUS

## 2018-02-15 MED ORDER — INSULIN ASPART 100 UNIT/ML ~~LOC~~ SOLN
5.0000 [IU] | Freq: Once | SUBCUTANEOUS | Status: AC
  Administered 2018-02-15: 5 [IU] via SUBCUTANEOUS

## 2018-02-15 MED ORDER — PHENYLEPHRINE 40 MCG/ML (10ML) SYRINGE FOR IV PUSH (FOR BLOOD PRESSURE SUPPORT)
PREFILLED_SYRINGE | INTRAVENOUS | Status: DC | PRN
  Administered 2018-02-15 (×2): 80 ug via INTRAVENOUS

## 2018-02-15 MED ORDER — PROMETHAZINE HCL 25 MG/ML IJ SOLN: 12.5000 mg | Freq: Four times a day (QID) | INTRAMUSCULAR | Status: DC | PRN

## 2018-02-15 MED ORDER — SODIUM CHLORIDE 0.9 % IJ SOLN
INTRAMUSCULAR | Status: AC
Start: 2018-02-15 — End: ?
  Filled 2018-02-15: qty 50

## 2018-02-15 MED ORDER — ROCURONIUM BROMIDE 10 MG/ML (PF) SYRINGE
PREFILLED_SYRINGE | INTRAVENOUS | Status: DC | PRN
  Administered 2018-02-15: 10 mg via INTRAVENOUS
  Administered 2018-02-15: 20 mg via INTRAVENOUS
  Administered 2018-02-15: 50 mg via INTRAVENOUS

## 2018-02-15 MED ORDER — INSULIN ASPART 100 UNIT/ML ~~LOC~~ SOLN
SUBCUTANEOUS | Status: AC
  Administered 2018-02-15: 5 [IU] via SUBCUTANEOUS
  Filled 2018-02-15: qty 1

## 2018-02-15 MED ORDER — SODIUM CHLORIDE 0.9 % IR SOLN
Status: DC | PRN
  Administered 2018-02-15: 1000 mL

## 2018-02-15 MED ORDER — CHLORHEXIDINE GLUCONATE 4 % EX LIQD: 60.0000 mL | Freq: Once | CUTANEOUS | Status: DC

## 2018-02-15 MED ORDER — SUGAMMADEX SODIUM 500 MG/5ML IV SOLN
INTRAVENOUS | Status: DC | PRN
  Administered 2018-02-15: 300 mg via INTRAVENOUS

## 2018-02-15 MED ORDER — ACETAMINOPHEN 160 MG/5ML PO SOLN
650.0000 mg | Freq: Four times a day (QID) | ORAL | Status: DC
  Administered 2018-02-15 – 2018-02-16 (×4): 650 mg via ORAL
  Filled 2018-02-15 (×5): qty 20.3

## 2018-02-15 MED ORDER — SIMETHICONE 80 MG PO CHEW
80.0000 mg | CHEWABLE_TABLET | Freq: Four times a day (QID) | ORAL | Status: DC | PRN
  Administered 2018-02-15: 80 mg via ORAL
  Filled 2018-02-15: qty 1

## 2018-02-15 MED ORDER — GABAPENTIN 100 MG PO CAPS
200.0000 mg | ORAL_CAPSULE | Freq: Two times a day (BID) | ORAL | Status: DC
  Administered 2018-02-15 – 2018-02-16 (×2): 200 mg via ORAL
  Filled 2018-02-15 (×3): qty 2

## 2018-02-15 MED ORDER — GLYCOPYRROLATE 0.2 MG/ML IV SOSY
PREFILLED_SYRINGE | INTRAVENOUS | Status: AC
  Filled 2018-02-15: qty 3

## 2018-02-15 MED ORDER — HYDROMORPHONE HCL 1 MG/ML IJ SOLN
0.2500 mg | INTRAMUSCULAR | Status: DC | PRN
  Administered 2018-02-15 (×2): 0.25 mg via INTRAVENOUS

## 2018-02-15 MED ORDER — MIDAZOLAM HCL 5 MG/5ML IJ SOLN
INTRAMUSCULAR | Status: DC | PRN
  Administered 2018-02-15: 2 mg via INTRAVENOUS

## 2018-02-15 MED ORDER — LACTATED RINGERS IV SOLN
INTRAVENOUS | Status: DC
  Administered 2018-02-15 (×2): via INTRAVENOUS

## 2018-02-15 MED ORDER — FENTANYL CITRATE (PF) 100 MCG/2ML IJ SOLN
INTRAMUSCULAR | Status: AC
  Filled 2018-02-15: qty 2

## 2018-02-15 MED ORDER — LIDOCAINE HCL 2 % IJ SOLN
INTRAMUSCULAR | Status: AC
  Filled 2018-02-15: qty 20

## 2018-02-15 MED ORDER — CEFOTETAN DISODIUM 2 G IJ SOLR
2.0000 g | INTRAMUSCULAR | Status: AC
  Administered 2018-02-15: 2 g via INTRAVENOUS
  Filled 2018-02-15: qty 2

## 2018-02-15 MED ORDER — SODIUM CHLORIDE 0.9 % IJ SOLN
INTRAMUSCULAR | Status: DC | PRN
  Administered 2018-02-15: 50 mL via INTRAVENOUS

## 2018-02-15 MED ORDER — PREMIER PROTEIN SHAKE
2.0000 [oz_av] | ORAL | Status: DC
  Administered 2018-02-16 (×3): 2 [oz_av] via ORAL

## 2018-02-15 MED ORDER — LIDOCAINE 20MG/ML (2%) 15 ML SYRINGE OPTIME
INTRAMUSCULAR | Status: DC | PRN
  Administered 2018-02-15: 1.5 mg/kg/h via INTRAVENOUS

## 2018-02-15 MED ORDER — GLYCOPYRROLATE PF 0.2 MG/ML IJ SOSY
PREFILLED_SYRINGE | INTRAMUSCULAR | Status: DC | PRN
  Administered 2018-02-15: 0.4 mg via INTRAVENOUS

## 2018-02-15 MED ORDER — POTASSIUM CHLORIDE IN NACL 20-0.45 MEQ/L-% IV SOLN
INTRAVENOUS | Status: DC
  Administered 2018-02-15: 12:00:00 via INTRAVENOUS
  Administered 2018-02-15 – 2018-02-16 (×2): 1000 mL via INTRAVENOUS
  Filled 2018-02-15 (×5): qty 1000

## 2018-02-15 MED ORDER — HEPARIN SODIUM (PORCINE) 5000 UNIT/ML IJ SOLN
5000.0000 [IU] | INTRAMUSCULAR | Status: AC
  Administered 2018-02-15: 5000 [IU] via SUBCUTANEOUS
  Filled 2018-02-15: qty 1

## 2018-02-15 MED ORDER — LACTATED RINGERS IR SOLN
Status: DC | PRN
  Administered 2018-02-15: 1000 mL

## 2018-02-15 MED ORDER — PROPOFOL 10 MG/ML IV BOLUS
INTRAVENOUS | Status: DC | PRN
  Administered 2018-02-15: 200 mg via INTRAVENOUS

## 2018-02-15 MED ORDER — EPHEDRINE 5 MG/ML INJ
INTRAVENOUS | Status: AC
  Filled 2018-02-15: qty 10

## 2018-02-15 MED ORDER — EPHEDRINE SULFATE-NACL 50-0.9 MG/10ML-% IV SOSY
PREFILLED_SYRINGE | INTRAVENOUS | Status: DC | PRN
  Administered 2018-02-15: 10 mg via INTRAVENOUS

## 2018-02-15 MED ORDER — DEXAMETHASONE SODIUM PHOSPHATE 10 MG/ML IJ SOLN: INTRAMUSCULAR | Status: DC | PRN

## 2018-02-15 MED ORDER — KETAMINE HCL 10 MG/ML IJ SOLN
INTRAMUSCULAR | Status: AC
  Filled 2018-02-15: qty 1

## 2018-02-15 MED ORDER — ENOXAPARIN SODIUM 30 MG/0.3ML ~~LOC~~ SOLN
30.0000 mg | Freq: Two times a day (BID) | SUBCUTANEOUS | Status: DC
  Administered 2018-02-15 – 2018-02-16 (×2): 30 mg via SUBCUTANEOUS
  Filled 2018-02-15 (×2): qty 0.3

## 2018-02-15 MED ORDER — MIDAZOLAM HCL 2 MG/2ML IJ SOLN
INTRAMUSCULAR | Status: AC
  Filled 2018-02-15: qty 2

## 2018-02-15 MED ORDER — SUGAMMADEX SODIUM 500 MG/5ML IV SOLN
INTRAVENOUS | Status: AC
  Filled 2018-02-15: qty 5

## 2018-02-15 MED ORDER — FENTANYL CITRATE (PF) 100 MCG/2ML IJ SOLN
INTRAMUSCULAR | Status: DC | PRN
  Administered 2018-02-15 (×3): 50 ug via INTRAVENOUS

## 2018-02-15 MED ORDER — HYDROMORPHONE HCL 1 MG/ML IJ SOLN
INTRAMUSCULAR | Status: AC
  Administered 2018-02-15: 0.25 mg via INTRAVENOUS
  Filled 2018-02-15: qty 1

## 2018-02-15 MED ORDER — BUPIVACAINE LIPOSOME 1.3 % IJ SUSP
INTRAMUSCULAR | Status: DC | PRN
  Administered 2018-02-15: 20 mL

## 2018-02-15 MED ORDER — EVICEL 5 ML EX KIT
PACK | CUTANEOUS | Status: DC | PRN
  Administered 2018-02-15: 5 mL

## 2018-02-15 SURGICAL SUPPLY — 68 items
APPLICATOR COTTON TIP 6IN STRL (MISCELLANEOUS) IMPLANT
APPLIER CLIP ROT 13.4 12 LRG (CLIP)
BANDAGE ADH SHEER 1  50/CT (GAUZE/BANDAGES/DRESSINGS) ×24 IMPLANT
BENZOIN TINCTURE PRP APPL 2/3 (GAUZE/BANDAGES/DRESSINGS) ×4 IMPLANT
BLADE SURG SZ11 CARB STEEL (BLADE) ×4 IMPLANT
CABLE HIGH FREQUENCY MONO STRZ (ELECTRODE) IMPLANT
CHLORAPREP W/TINT 26ML (MISCELLANEOUS) ×8 IMPLANT
CLIP APPLIE ROT 13.4 12 LRG (CLIP) IMPLANT
CLIP SUT LAPRA TY ABSORB (SUTURE) ×8 IMPLANT
CLOSURE WOUND 1/2 X4 (GAUZE/BANDAGES/DRESSINGS) ×1
CUTTER FLEX LINEAR 45M (STAPLE) IMPLANT
DEVICE SUT QUICK LOAD TK 5 (STAPLE) IMPLANT
DEVICE SUT TI-KNOT TK 5X26 (MISCELLANEOUS) IMPLANT
DEVICE SUTURE ENDOST 10MM (ENDOMECHANICALS) ×4 IMPLANT
DEVICE TI KNOT TK5 (MISCELLANEOUS)
DRAIN PENROSE 18X1/4 LTX STRL (WOUND CARE) ×4 IMPLANT
ELECT REM PT RETURN 15FT ADLT (MISCELLANEOUS) ×4 IMPLANT
GAUZE 4X4 16PLY RFD (DISPOSABLE) ×4 IMPLANT
GAUZE SPONGE 4X4 12PLY STRL (GAUZE/BANDAGES/DRESSINGS) IMPLANT
GLOVE BIO SURGEON STRL SZ7.5 (GLOVE) IMPLANT
GLOVE INDICATOR 8.0 STRL GRN (GLOVE) ×4 IMPLANT
GOWN STRL REUS W/TWL XL LVL3 (GOWN DISPOSABLE) ×16 IMPLANT
HOVERMATT SINGLE USE (MISCELLANEOUS) ×4 IMPLANT
KIT BASIN OR (CUSTOM PROCEDURE TRAY) ×4 IMPLANT
KIT GASTRIC LAVAGE 34FR ADT (SET/KITS/TRAYS/PACK) ×4 IMPLANT
LUBRICANT JELLY K Y 4OZ (MISCELLANEOUS) ×4 IMPLANT
MARKER SKIN DUAL TIP RULER LAB (MISCELLANEOUS) ×4 IMPLANT
NEEDLE SPNL 22GX3.5 QUINCKE BK (NEEDLE) ×4 IMPLANT
PACK CARDIOVASCULAR III (CUSTOM PROCEDURE TRAY) ×4 IMPLANT
QUICK LOAD TK 5 (STAPLE)
RELOAD 45 VASCULAR/THIN (ENDOMECHANICALS) IMPLANT
RELOAD ENDO STITCH 2.0 (ENDOMECHANICALS) ×16
RELOAD STAPLE TA45 3.5 REG BLU (ENDOMECHANICALS) IMPLANT
RELOAD STAPLER BLUE 60MM (STAPLE) ×6 IMPLANT
RELOAD STAPLER GOLD 60MM (STAPLE) ×2 IMPLANT
RELOAD STAPLER WHITE 60MM (STAPLE) ×4 IMPLANT
SCISSORS LAP 5X45 EPIX DISP (ENDOMECHANICALS) ×4 IMPLANT
SET IRRIG TUBING LAPAROSCOPIC (IRRIGATION / IRRIGATOR) ×4 IMPLANT
SHEARS HARMONIC ACE PLUS 45CM (MISCELLANEOUS) ×4 IMPLANT
SLEEVE XCEL OPT CAN 5 100 (ENDOMECHANICALS) ×4 IMPLANT
SOLUTION ANTI FOG 6CC (MISCELLANEOUS) ×4 IMPLANT
STAPLER ECHELON BIOABSB 60 FLE (MISCELLANEOUS) IMPLANT
STAPLER ECHELON LONG 60 440 (INSTRUMENTS) ×4 IMPLANT
STAPLER RELOAD BLUE 60MM (STAPLE) ×12
STAPLER RELOAD GOLD 60MM (STAPLE) ×4
STAPLER RELOAD WHITE 60MM (STAPLE) ×8
STRIP CLOSURE SKIN 1/2X4 (GAUZE/BANDAGES/DRESSINGS) ×3 IMPLANT
SUT MNCRL AB 4-0 PS2 18 (SUTURE) ×4 IMPLANT
SUT RELOAD ENDO STITCH 2 48X1 (ENDOMECHANICALS) ×8
SUT RELOAD ENDO STITCH 2.0 (ENDOMECHANICALS) ×8
SUT SURGIDAC NAB ES-9 0 48 120 (SUTURE) IMPLANT
SUT VIC AB 2-0 SH 27 (SUTURE) ×2
SUT VIC AB 2-0 SH 27X BRD (SUTURE) ×2 IMPLANT
SUTURE RELOAD END STTCH 2 48X1 (ENDOMECHANICALS) ×8 IMPLANT
SUTURE RELOAD ENDO STITCH 2.0 (ENDOMECHANICALS) ×8 IMPLANT
SYR 10ML ECCENTRIC (SYRINGE) ×4 IMPLANT
SYR 20CC LL (SYRINGE) ×8 IMPLANT
TIP RIGID 35CM EVICEL (HEMOSTASIS) ×4 IMPLANT
TOWEL OR 17X26 10 PK STRL BLUE (TOWEL DISPOSABLE) ×4 IMPLANT
TOWEL OR NON WOVEN STRL DISP B (DISPOSABLE) ×4 IMPLANT
TRAY FOLEY MTR SLVR 16FR STAT (SET/KITS/TRAYS/PACK) ×4 IMPLANT
TROCAR BLADELESS OPT 5 100 (ENDOMECHANICALS) ×4 IMPLANT
TROCAR UNIVERSAL OPT 12M 100M (ENDOMECHANICALS) ×12 IMPLANT
TROCAR XCEL 12X100 BLDLESS (ENDOMECHANICALS) ×4 IMPLANT
TUBING CONNECTING 10 (TUBING) ×6 IMPLANT
TUBING CONNECTING 10' (TUBING) ×2
TUBING ENDO SMARTCAP PENTAX (MISCELLANEOUS) ×4 IMPLANT
TUBING INSUF HEATED (TUBING) ×4 IMPLANT

## 2018-02-15 NOTE — Anesthesia Procedure Notes (Signed)
Procedure Name: Intubation Date/Time: 02/15/2018 7:38 AM Performed by: Lind Covert, CRNA Pre-anesthesia Checklist: Patient identified, Emergency Drugs available, Suction available, Patient being monitored and Timeout performed Patient Re-evaluated:Patient Re-evaluated prior to induction Oxygen Delivery Method: Circle system utilized Preoxygenation: Pre-oxygenation with 100% oxygen Induction Type: IV induction Laryngoscope Size: Mac and 4 Grade View: Grade I Tube type: Oral Tube size: 7.5 mm Number of attempts: 1 Airway Equipment and Method: Stylet Placement Confirmation: ETT inserted through vocal cords under direct vision,  positive ETCO2 and breath sounds checked- equal and bilateral Secured at: 21 cm Tube secured with: Tape Dental Injury: Teeth and Oropharynx as per pre-operative assessment

## 2018-02-15 NOTE — Discharge Instructions (Signed)
° ° ° °GASTRIC BYPASS/SLEEVE ° Home Care Instructions ° ° These instructions are to help you care for yourself when you go home. ° °Call: If you have any problems. °• Call [phone] and ask for the surgeon on call °• If you need immediate help, come to the ER at [facility].  °• Tell the ER staff that you are a new post-op gastric bypass or gastric sleeve patient °  °Signs and symptoms to report: • Severe vomiting or nausea °o If you cannot keep down clear liquids for longer than 1 day, call your surgeon  °• Abdominal pain that does not get better after taking your pain medication °• Fever over 100.4° F with chills °• Heart beating over 100 beats a minute °• Shortness of breath at rest °• Chest pain °•  Redness, swelling, drainage, or foul odor at incision (surgical) sites °•  If your incisions open or pull apart °• Swelling or pain in calf (lower leg) °• Diarrhea (Loose bowel movements that happen often), frequent watery, uncontrolled bowel movements °• Constipation, (no bowel movements for 3 days) if this happens: Pick one °o Milk of Magnesia, 2 tablespoons by mouth, 3 times a day for 2 days if needed °o Stop taking Milk of Magnesia once you have a bowel movement °o Call your doctor if constipation continues °Or °o Miralax  (instead of Milk of Magnesia) following the label instructions °o Stop taking Miralax once you have a bowel movement °o Call your doctor if constipation continues °• Anything you think is not normal °  °Normal side effects after surgery: • Unable to sleep at night or unable to focus °• Irritability or moody °• Being tearful (crying) or depressed °These are common complaints, possibly related to your anesthesia medications that put you to sleep, stress of surgery, and change in lifestyle.  This usually goes away a few weeks after surgery.  If these feelings continue, call your primary care doctor. °  °Wound Care: You may have surgical glue, steri-strips, or staples over your incisions after  surgery °• Surgical glue:  Looks like a clear film over your incisions and will wear off a little at a time °• Steri-strips: Strips of tape over your incisions. You may notice a yellowish color on the skin under the steri-strips. This is used to make the   steri-strips stick better. Do not pull the steri-strips off - let them fall off °• Staples: Staples may be removed before you leave the hospital °o If you go home with staples, call [facility], [phone] at for an appointment with your surgeon’s nurse to have staples removed 10 days after surgery. °• Showering: You may shower two (2) days after your surgery unless your surgeon tells you differently °o Wash gently around incisions with warm soapy water, rinse well, and gently pat dry  °o No tub baths until staples are removed, steri-strips fall off or glue is gone.  °  °Medications: • Medications should be liquid or crushed if larger than the size of a dime °• Extended release pills (medication that release a little bit at a time through the day) should NOT be crushed or cut. (examples include XL, ER, DR, SR) °• Depending on the size and number of medications you take, you may need to space (take a few throughout the day)/change the time you take your medications so that you do not over-fill your pouch (smaller stomach) °• Make sure you follow-up with your primary care doctor to   make medication changes needed during rapid weight loss and life-style changes °• If you have diabetes, follow up with the doctor that orders your diabetes medication(s) within one week after surgery and check your blood sugar regularly. °• Do not drive while taking prescription pain medication  °• It is ok to take Tylenol by the bottle instructions with your pain medicine or instead of your pain medicine as needed.  DO NOT TAKE NSAIDS (EXAMPLES OF NSAIDS:  IBUPROFREN/ NAPROXEN)  °Diet:                    First 2 Weeks ° You will see the dietician t about two (2) weeks  after your surgery. The dietician will increase the types of foods you can eat if you are handling liquids well: °• If you have severe vomiting or nausea and cannot keep down clear liquids lasting longer than 1 day, call your surgeon @ ([phone]) °Protein Shake °• Drink at least 2 ounces of shake 5-6 times per day °• Each serving of protein shakes (usually 8 - 12 ounces) should have: °o 15 grams of protein  °o And no more than 5 grams of carbohydrate  °• Goal for protein each day: °o Men = 80 grams per day °o Women = 60 grams per day °• Protein powder may be added to fluids such as non-fat milk or Lactaid milk or unsweetened Soy/Almond milk (limit to 35 grams added protein powder per serving) ° °Hydration °• Slowly increase the amount of water and other clear liquids as tolerated (See Acceptable Fluids) °• Slowly increase the amount of protein shake as tolerated  °•  Sip fluids slowly and throughout the day.  Do not use straws. °• May use sugar substitutes in small amounts (no more than 6 - 8 packets per day; i.e. Splenda) ° °Fluid Goal °• The first goal is to drink at least 8 ounces of protein shake/drink per day (or as directed by the nutritionist); some examples of protein shakes are Syntrax Nectar, Adkins Advantage, EAS Edge HP, and Unjury. See handout from pre-op Bariatric Education Class: °o Slowly increase the amount of protein shake you drink as tolerated °o You may find it easier to slowly sip shakes throughout the day °o It is important to get your proteins in first °• Your fluid goal is to drink 64 - 100 ounces of fluid daily °o It may take a few weeks to build up to this °• 32 oz (or more) should be clear liquids  °And  °• 32 oz (or more) should be full liquids (see below for examples) °• Liquids should not contain sugar, caffeine, or carbonation ° °Clear Liquids: °• Water or Sugar-free flavored water (i.e. Fruit H2O, Propel) °• Decaffeinated coffee or tea (sugar-free) °• Crystal Lite, Wyler’s Lite,  Minute Maid Lite °• Sugar-free Jell-O °• Bouillon or broth °• Sugar-free Popsicle:   *Less than 20 calories each; Limit 1 per day ° °Full Liquids: °Protein Shakes/Drinks + 2 choices per day of other full liquids °• Full liquids must be: °o No More Than 15 grams of Carbs per serving  °o No More Than 3 grams of Fat per serving °• Strained low-fat cream soup (except Cream of Potato or Tomato) °• Non-Fat milk °• Fat-free Lactaid Milk °• Unsweetened Soy Or Unsweetened Almond Milk °• Low Sugar yogurt (Dannon Lite & Fit, Greek yogurt; Oikos Triple Zero; Chobani Simply 100; Yoplait 100 calorie Greek - No Fruit on the Bottom) ° °  °Vitamins   and Minerals • Start 1 day after surgery unless otherwise directed by your surgeon °• 2 Chewable Bariatric Specific Multivitamin / Multimineral Supplement with iron (Example: Bariatric Advantage Multi EA) °• Chewable Calcium with Vitamin D-3 °(Example: 3 Chewable Calcium Plus 600 with Vitamin D-3) °o Take 500 mg three (3) times a day for a total of 1500 mg each day °o Do not take all 3 doses of calcium at one time as it may cause constipation, and you can only absorb 500 mg  at a time  °o Do not mix multivitamins containing iron with calcium supplements; take 2 hours apart °• Menstruating women and those with a history of anemia (a blood disease that causes weakness) may need extra iron °o Talk with your doctor to see if you need more iron °• Do not stop taking or change any vitamins or minerals until you talk to your dietitian or surgeon °• Your Dietitian and/or surgeon must approve all vitamin and mineral supplements °  °Activity and Exercise: Limit your physical activity as instructed by your doctor.  It is important to continue walking at home.  During this time, use these guidelines: °• Do not lift anything greater than ten (10) pounds for at least two (2) weeks °• Do not go back to work or drive until your surgeon says you can °• You may have sex when you feel comfortable  °o It is  VERY important for female patients to use a reliable birth control method; fertility often increases after surgery  °o All hormonal birth control will be ineffective for 30 days after surgery due to medications given during surgery a barrier method must be used. °o Do not get pregnant for at least 18 months °• Start exercising as soon as your doctor tells you that you can °o Make sure your doctor approves any physical activity °• Start with a simple walking program °• Walk 5-15 minutes each day, 7 days per week.  °• Slowly increase until you are walking 30-45 minutes per day °Consider joining our BELT program. [phone] or email [email or web address] °  °Special Instructions Things to remember: °• Use your CPAP when sleeping if this applies to you ° °• [facility] has two free Bariatric Surgery Support Groups that meet monthly °o The 3rd Thursday of each month, 6 pm, [facility]  °o The 2nd Friday of each month, 11:45 am in the private dining room in the basement of  °• It is very important to keep all follow up appointments with your surgeon, dietitian, primary care physician, and behavioral health practitioner °• Routine follow up schedule with your surgeon include appointments at 2-3 weeks, 6-8 weeks, 6 months, and 1 year at a minimum.  Your surgeon may request to see you more often.   °o After the first year, please follow up with your bariatric surgeon and dietitian at least once a year in order to maintain best weight loss results °[facility]: [phone] °[facility]: [phone] °Bariatric Nurse Coordinator: [phone] °  °   Reviewed and Endorsed  °by [place] Patient Education Committee, June, 2016 °Edits Approved: Aug, 2018 ° ° ° °

## 2018-02-15 NOTE — Op Note (Signed)
Irineo AxonStaci H Varas [id number] 10-Dec-1970. 02/15/2018  Preoperative diagnosis:    Severe obesity (BMI 41)    Diabetes mellitus type 2 in obese    GERD (gastroesophageal reflux disease)   Fatty liver  Postoperative  diagnosis:  1. same  Surgical procedure: Laparoscopic Roux-en-Y gastric bypass (ante-colic, ante-gastric); upper endoscopy  Surgeon: Atilano InaEric M Jadalee Westcott, M.D. FACS  Asst.: Glenna FellowsBenjamin Hoxworth MD FACS  Anesthesia: General plus exparel/marcaine mix  Complications: None   EBL: Minimal   Drains: None   Disposition: PACU in good condition   Indications for procedure: 47 y.o. yo female with morbid obesity who has been unsuccessful at sustained weight loss. The patient's comorbidities are listed above. We discussed the risk and benefits of surgery including but not limited to anesthesia risk, bleeding, infection, blood clot formation, anastomotic leak, anastomotic stricture, ulcer formation, death, respiratory complications, intestinal blockage, internal hernia, gallstone formation, vitamin and nutritional deficiencies, injury to surrounding structures, failure to lose weight and mood changes.   Description of procedure: Patient is brought to the operating room and general anesthesia induced. The patient had received preoperative broad-spectrum IV antibiotics and subcutaneous heparin. The abdomen was widely sterilely prepped with Chloraprep and draped. Patient timeout was performed and correct patient and procedure confirmed. Access was obtained with a 12 mm Optiview trocar in the left upper quadrant and pneumoperitoneum established without difficulty. Under direct vision 12 mm trocars were placed laterally in the right upper quadrant, right upper quadrant midclavicular line, and to the left and above the umbilicus for the camera port. A 5 mm trocar was placed laterally in the left upper quadrant. Some omental adhesions to the anterior abdominal wall in the midline were taken down.   Exparel/marcaine mix was infiltrated in bilateral lateral abdominal walls as a TAP block.  The omentum was brought into the upper abdomen and the transverse mesocolon elevated and the ligament of Treitz clearly identified. A 40 cm biliopancreatic limb was then carefully measured from the ligament of Treitz. The small intestine was divided at this point with a single firing of the white load linear stapler. A Penrose drain was sutured to the end of the Roux-en-Y limb for later identification. A 100 cm Roux-en-Y limb was then carefully measured. At this point a side-to-side anastomosis was created between the Roux limb and the end of the biliopancreatic limb. This was accomplished with a single firing of the 60 mm white load linear stapler. The common enterotomy was closed with a running 2-0 Vicryl begun at either end of the enterotomy and tied centrally. Eviceal tissue sealant was placed over the anastomosis. The mesenteric defect was then closed with running 2-0 silk. The omentum was then divided with the harmonic scalpel up towards the transverse colon to allow mobility of the Roux limb toward the gastric pouch. The patient was then placed in steep reversed Trendelenburg. Through a 5 mm subxiphoid site the [facility] and the left lobe of the liver elevated with excellent exposure of the upper stomach and hiatus. The angle of Hiss was then mobilized with the harmonic scalpel. A 4 cm gastric pouch was then carefully measured along the lesser curve of the stomach. Dissection was carried along the lesser curve at this point with the Harmonic scalpel working carefully back toward the lesser sac at right angles to the lesser curve. The free lesser sac was then entered. After being sure all tubes were removed from the stomach an initial firing of the gold load 60 mm linear stapler was  fired at right angles across the lesser curve for about 4 cm. The gastric pouch was further mobilized posteriorly and  then the pouch was completed with 2 further firings of the 60 mm blue load linear stapler up through the previously dissected angle of His. It was ensured that the pouch was completely mobilized away from the gastric remnant. This created a nice tubular 4-5 cm gastric pouch. The Roux limb was then brought up in an antecolic fashion with the candycane facing to the patient's left without undue tension. The gastrojejunostomy was created with an initial posterior row of 2-0 Vicryl between the Roux limb and the staple line of the gastric pouch. Enterotomies were then made in the gastric pouch and the Roux limb with the harmonic scalpel and at approximately 2-2-1/2 cm anastomosis was created with a single firing of the 60mm blue load linear stapler. The staple line was inspected and was intact without bleeding. The common enterotomy was then closed with running 2-0 Vicryl begun at either end and tied centrally. The Ewall tube was then easily passed through the anastomosis and an outer anterior layer of running 2-0 Vicryl was placed. The Ewald tube was removed. With the outlet of the gastrojejunostomy clamped and under saline irrigation the assistant performed upper endoscopy and with the gastric pouch tensely distended with air-there was no evidence of leak on this test. The pouch was desufflated. The Vonita Moss defect was closed with running 2-0 silk. The abdomen was inspected for any evidence of bleeding or bowel injury and everything looked fine. The Nathanson retractor was removed under direct vision after coating the anastomosis with Eviceal tissue sealant. All CO2 was evacuated and trochars removed. Skin incisions were closed with 4-0 monocryl in a subcuticular fashion followed by benzoin, steri-strips and bandages. Sponge needle and instrument counts were correct. The patient was taken to the PACU in good condition.    Mary Sella. Andrey Campanile, MD, FACS General, Bariatric, & Minimally Invasive Surgery [facility]  Surgery, [place]

## 2018-02-15 NOTE — Progress Notes (Signed)
Discussed post op day goals with patient including ambulation, IS, diet progression, pain, and nausea control.  Questions answered. 

## 2018-02-15 NOTE — Op Note (Signed)
Upper GI endoscopy is performed at the completion of laparoscopic Roux-en-Y gastric bypass by Dr. Almeta MonasWiulson. The Olympus video endoscope was inserted into the upper esophagus and then passed under direct vision to the EG junction. The small gastric pouch was insufflated with air while the gastric outlet was clamped under irrigation by the operating surgeon. There was no evidence of leak. The anastomosis was visualized and was patent. Suture and staple lines were intact and without bleeding. The pouch was tubular and measured 4-5 cm in length. At the completion of the procedure the pouch was desufflated and the scope withdrawn.

## 2018-02-15 NOTE — Interval H&P Note (Signed)
History and Physical Interval Note:  02/15/2018 7:33 AM  Chelsea Mitchell  has presented today for surgery, with the diagnosis of Morbid Obesity, DM II, Chronic GERD, Gastric Ulcer  The various methods of treatment have been discussed with the patient and family. After consideration of risks, benefits and other options for treatment, the patient has consented to  Procedure(s): LAPAROSCOPIC ROUX-EN-Y GASTRIC BYPASS WITH UPPER ENDOSCOPY (N/A) as a surgical intervention .  The patient's history has been reviewed, patient examined, no change in status, stable for surgery.  I have reviewed the patient's chart and labs.  Questions were answered to the patient's satisfaction.     Gaynelle AduEric Ciarra Braddy

## 2018-02-15 NOTE — H&P (Signed)
Chelsea Mitchell Documented: 02/03/2018 3:36 PM Location: [facility] Patient #: [id number] DOB: Oct 18, 1970 Separated / Language: [place] / Race: White Female  History of Present Illness Chelsea Areola M. Estel Tonelli MD; 02/15/2018 7:31 AM) The patient is a 47 year old female who presents for a bariatric surgery evaluation. she comes in for a preop appt. no changes since initial visit. ugi and cxr unremarkable. we reviewed her initial blood work. no cp/sob/doe/pnd/orthopnea/tob use.  06/2017 She is referred by Dr Patsey Berthold for evaluation of weight loss surgery. She is accompanied by her mother. She completed our seminar on line. She is interested in the sleeve gastrectomy but does have questions about a gastric bypass. She wants to improve her overall health and be able to be more active. Despite numerous attempts in the past she has been unsuccessful for sustained weight loss. She has tried Atkins, [employer], Belviq , and Adipex-all without any long-term success. She has discussed her weight with her primary care physician throughout the past year.  Her comorbidities include GERD, diabetes mellitus type 2, and chronic right knee pain  She denies any chest pain, chest pressure, source of breath, orthopnea, paroxysmal nocturnal dyspnea, orthopnea, peripheral edema, or personal family history of blood clots. Her stop pain questionnaire was negative.  She does have heartburn daily. She takes Protonix twice a day. She states that was how it was prescribed to her. She states that she has undergone an upper endoscopy in the past 5 years. She did have a history of constipation going twice a week but started taking MiraLAX daily and now has a bowel movement daily. She has had a laparoscopic cholecystectomy. She denies any melena, hematochezia, dysuria or hematuria. She has chronic right knee pain. She states she is been diabetic for about a year. She is on metformin no insulin. She denies any TIAs  or amaurosis fugax. She denies any tobacco. Drinks alcohol on a rare occasion. Works as a Architectural technologist for [employer] Chelsea Areola M. Andrey Campanile, MD; 02/15/2018 7:33 AM) OBESITY, MORBID, BMI 40.0-49.9 (E66.01) The patient meets weight loss surgery criteria.  Past Surgical History Chelsea Areola M. Andrey Campanile, MD; 02/15/2018 7:33 AM) Cesarean Section - 1 Gallbladder Surgery - Laparoscopic Oral Surgery  Diagnostic Studies History Chelsea Areola M. Andrey Campanile, MD; 02/15/2018 7:33 AM) Colonoscopy 1-5 years ago Mammogram within last year Pap Smear 1-5 years ago  Allergies (Tanisha A. Manson Passey, RMA; 02/03/2018 3:38 PM) No Known Drug Allergies [07/21/2017]: Allergies Reconciled  Medication History (Tanisha A. Manson Passey, RMA; 02/03/2018 3:38 PM) Pantoprazole Sodium (40MG  Tablet DR, Oral) Active. MetFORMIN HCl (500MG  Tablet, Oral) Active. Sertraline HCl (100MG  Tablet, Oral) Active. Medications Reconciled  Social History Chelsea Areola M. Andrey Campanile, MD; 02/15/2018 7:33 AM) Alcohol use Occasional alcohol use. Caffeine use Carbonated beverages, Coffee, Tea. No drug use Tobacco use Never smoker.  Family History Chelsea Areola M. Andrey Campanile, MD; 02/15/2018 7:33 AM) Arthritis Mother. Cervical Cancer Family Members In General. Colon Cancer Family Members In General. Colon Polyps Mother. Heart disease in female family member before age 49 Prostate Cancer Father.  Pregnancy / Birth History Chelsea Areola M. Andrey Campanile, MD; 02/15/2018 7:33 AM) Age at menarche 11 years. Gravida 2 Irregular periods Maternal age 39-25 Para 2  Other Problems Chelsea Areola M. Andrey Campanile, MD; 02/15/2018 7:33 AM) Anxiety Disorder Cholelithiasis Gastric Ulcer Kidney Stone DIABETES MELLITUS TYPE 2 IN OBESE (E11.69) CHRONIC GERD (K21.9)     Review of Systems Chelsea Areola M. Damarious Holtsclaw MD; 02/15/2018 7:31 AM) General Not Present- Appetite Loss, Chills, Fatigue, Fever, Night Sweats, Weight Gain  and Weight Loss. Skin Present- Dryness. Not Present-  Change in Wart/Mole, Hives, Jaundice, New Lesions, Non-Healing Wounds, Rash and Ulcer. HEENT Present- Seasonal Allergies and Wears glasses/contact lenses. Not Present- Earache, Hearing Loss, Hoarseness, Nose Bleed, Oral Ulcers, Ringing in the Ears, Sinus Pain, Sore Throat, Visual Disturbances and Yellow Eyes. Respiratory Not Present- Bloody sputum, Chronic Cough, Difficulty Breathing, Snoring and Wheezing. Breast Not Present- Breast Mass, Breast Pain, Nipple Discharge and Skin Changes. Cardiovascular Not Present- Chest Pain, Difficulty Breathing Lying Down, Leg Cramps, Palpitations, Rapid Heart Rate, Shortness of Breath and Swelling of Extremities. Gastrointestinal Not Present- Abdominal Pain, Bloating, Bloody Stool, Change in Bowel Habits, Chronic diarrhea, Constipation, Difficulty Swallowing, Excessive gas, Gets full quickly at meals, Hemorrhoids, Indigestion, Nausea, Rectal Pain and Vomiting. Female Genitourinary Not Present- Frequency, Nocturia, Painful Urination, Pelvic Pain and Urgency. Musculoskeletal Not Present- Back Pain, Joint Pain, Joint Stiffness, Muscle Pain, Muscle Weakness and Swelling of Extremities. Neurological Not Present- Decreased Memory, Fainting, Headaches, Numbness, Seizures, Tingling, Tremor, Trouble walking and Weakness. Psychiatric Not Present- Anxiety, Bipolar, Change in Sleep Pattern, Depression, Fearful and Frequent crying. Endocrine Not Present- Cold Intolerance, Excessive Hunger, Hair Changes, Heat Intolerance, Hot flashes and New Diabetes. Hematology Not Present- Blood Thinners, Easy Bruising, Excessive bleeding, Gland problems, HIV and Persistent Infections.  Vitals (Tanisha A. Brown RMA; 02/03/2018 3:37 PM) 02/03/2018 3:36 PM Weight: 258.6 lb Height: 66.5in Body Surface Area: 2.24 m Body Mass Index: 41.11 kg/m  Temp.: 98.66F  Pulse: 92 (Regular)  BP: 138/84 (Sitting, Left Arm, Standard)      Physical Exam Chelsea Areola(Yandel Zeiner M. Lew Prout MD; 02/15/2018 7:31  AM)  General Mental Status-Alert. General Appearance-Consistent with stated age. Hydration-Well hydrated. Voice-Normal. Note: severe obesity; truncal  Head and Neck Head-normocephalic, atraumatic with no lesions or palpable masses. Trachea-midline. Thyroid Gland Characteristics - normal size and consistency.  Eye Eyeball - Bilateral-Extraocular movements intact. Sclera/Conjunctiva - Bilateral-No scleral icterus.  ENMT Ears -Note:normal external ears.  Mouth and Throat -Note:lips intact.   Chest and Lung Exam Chest and lung exam reveals -quiet, even and easy respiratory effort with no use of accessory muscles and on auscultation, normal breath sounds, no adventitious sounds and normal vocal resonance. Inspection Chest Wall - Normal. Back - normal.  Breast - Did not examine.  Cardiovascular Cardiovascular examination reveals -normal heart sounds, regular rate and rhythm with no murmurs and normal pedal pulses bilaterally.  Abdomen Inspection Inspection of the abdomen reveals - No Hernias. Skin - Scar - Note: well healed trocar sites. Palpation/Percussion Palpation and Percussion of the abdomen reveal - Soft, Non Tender, No Rebound tenderness, No Rigidity (guarding) and No hepatosplenomegaly. Auscultation Auscultation of the abdomen reveals - Bowel sounds normal.  Peripheral Vascular Upper Extremity Palpation - Pulses bilaterally normal.  Neurologic Neurologic evaluation reveals -alert and oriented x 3 with no impairment of recent or remote memory. Mental Status-Normal.  Neuropsychiatric The patient's mood and affect are described as -normal. Judgment and Insight-insight is appropriate concerning matters relevant to self.  Musculoskeletal Normal Exam - Left-Upper Extremity Strength Normal and Lower Extremity Strength Normal. Normal Exam - Right-Upper Extremity Strength Normal and Lower Extremity Strength  Normal.  Lymphatic Head & Neck  General Head & Neck Lymphatics: Bilateral - Description - Normal. Axillary - Did not examine. Femoral & Inguinal - Did not examine.    Assessment & Plan Chelsea Areola(Caley Ciaramitaro M. Shyheim Tanney MD; 02/15/2018 7:33 AM)  OBESITY, MORBID, BMI 40.0-49.9 (E66.01) Story: The patient meets weight loss surgery criteria. Impression: The patient meets weight loss surgery criteria. I think the  patient would be an acceptable candidate for Laparoscopic Roux-en-Y Gastric bypass.  We re discussed laparoscopic Roux-en-Y gastric bypass. all of her questions were asked and answered. we discussed the postop process and typical postop issues. she has attended preop class.  Current Plans Pt Education - EMW_preopbariatric  DIABETES MELLITUS TYPE 2 IN OBESE (E11.69)   CHRONIC GERD (K21.9) Impression: We will try to get a copy of her endoscopy report. With respect to her GERD, we did discuss that sleeve gastrectomy may worsen her GERD. We discussed that generally a gastric bypass has better results with respect to improvement in GERD.  Mary Sella. Andrey Campanile, MD, FACS General, Bariatric, & Minimally Invasive Surgery [facility], [place]

## 2018-02-15 NOTE — Anesthesia Postprocedure Evaluation (Signed)
Anesthesia Post Note  Patient: Chelsea Mitchell  Procedure(s) Performed: LAPAROSCOPIC ROUX-EN-Y GASTRIC BYPASS WITH UPPER ENDOSCOPY (N/A Abdomen) UPPER GI ENDOSCOPY     Patient location during evaluation: PACU Anesthesia Type: General Level of consciousness: awake and alert Pain management: pain level controlled Vital Signs Assessment: post-procedure vital signs reviewed and stable Respiratory status: spontaneous breathing, nonlabored ventilation, respiratory function stable and patient connected to nasal cannula oxygen Cardiovascular status: blood pressure returned to baseline and stable Postop Assessment: no apparent nausea or vomiting Anesthetic complications: no    Last Vitals:  Vitals:   02/15/18 1115 02/15/18 1137  BP: 127/71 130/84  Pulse: 82 90  Resp: 14 16  Temp: 36.9 C 37.3 C  SpO2: 95% 98%    Last Pain:  Vitals:   02/15/18 1115  TempSrc:   PainSc: Asleep                 Laurell Coalson,W. EDMOND

## 2018-02-15 NOTE — Progress Notes (Signed)
Patient started on protein drink ,instructed and educated about the process patient shows understanding . We will continue to monitor.

## 2018-02-15 NOTE — Transfer of Care (Signed)
Immediate Anesthesia Transfer of Care Note  Patient: Chelsea Mitchell  Procedure(s) Performed: LAPAROSCOPIC ROUX-EN-Y GASTRIC BYPASS WITH UPPER ENDOSCOPY (N/A Abdomen) UPPER GI ENDOSCOPY  Patient Location: PACU  Anesthesia Type:General  Level of Consciousness: sedated  Airway & Oxygen Therapy: Patient Spontanous Breathing and Patient connected to face mask oxygen  Post-op Assessment: Report given to RN and Post -op Vital signs reviewed and stable  Post vital signs: Reviewed and stable  Last Vitals:  Vitals Value Taken Time  BP    Temp    Pulse 93 02/15/2018 10:04 AM  Resp 25 02/15/2018 10:04 AM  SpO2 93 % 02/15/2018 10:04 AM  Vitals shown include unvalidated device data.  Last Pain:  Vitals:   02/15/18 0551  TempSrc: Oral         Complications: No apparent anesthesia complications

## 2018-02-16 LAB — COMPREHENSIVE METABOLIC PANEL
ALK PHOS: 84 U/L (ref 38–126)
ALT: 86 U/L — ABNORMAL HIGH (ref 0–44)
ANION GAP: 10 (ref 5–15)
AST: 83 U/L — ABNORMAL HIGH (ref 15–41)
Albumin: 3.5 g/dL (ref 3.5–5.0)
BUN: 8 mg/dL (ref 6–20)
CALCIUM: 8.7 mg/dL — AB (ref 8.9–10.3)
CO2: 25 mmol/L (ref 22–32)
Chloride: 106 mmol/L (ref 98–111)
Creatinine, Ser: 0.58 mg/dL (ref 0.44–1.00)
Glucose, Bld: 138 mg/dL — ABNORMAL HIGH (ref 70–99)
Potassium: 3.9 mmol/L (ref 3.5–5.1)
Sodium: 141 mmol/L (ref 135–145)
TOTAL PROTEIN: 6.8 g/dL (ref 6.5–8.1)
Total Bilirubin: 0.5 mg/dL (ref 0.3–1.2)

## 2018-02-16 LAB — CBC WITH DIFFERENTIAL/PLATELET
Basophils Absolute: 0 10*3/uL (ref 0.0–0.1)
Basophils Relative: 0 %
Eosinophils Absolute: 0.1 10*3/uL (ref 0.0–0.7)
Eosinophils Relative: 1 %
HCT: 39.5 % (ref 36.0–46.0)
HEMOGLOBIN: 13 g/dL (ref 12.0–15.0)
LYMPHS ABS: 3 10*3/uL (ref 0.7–4.0)
Lymphocytes Relative: 29 %
MCH: 29.8 pg (ref 26.0–34.0)
MCHC: 32.9 g/dL (ref 30.0–36.0)
MCV: 90.6 fL (ref 78.0–100.0)
MONOS PCT: 11 %
Monocytes Absolute: 1.2 10*3/uL — ABNORMAL HIGH (ref 0.1–1.0)
NEUTROS ABS: 6.2 10*3/uL (ref 1.7–7.7)
NEUTROS PCT: 59 %
Platelets: 245 10*3/uL (ref 150–400)
RBC: 4.36 MIL/uL (ref 3.87–5.11)
RDW: 13.8 % (ref 11.5–15.5)
WBC: 10.5 10*3/uL (ref 4.0–10.5)

## 2018-02-16 LAB — GLUCOSE, CAPILLARY
GLUCOSE-CAPILLARY: 131 mg/dL — AB (ref 70–99)
GLUCOSE-CAPILLARY: 160 mg/dL — AB (ref 70–99)
Glucose-Capillary: 124 mg/dL — ABNORMAL HIGH (ref 70–99)

## 2018-02-16 MED ORDER — ONDANSETRON 4 MG PO TBDP: 4.0000 mg | ORAL_TABLET | Freq: Four times a day (QID) | ORAL | 0 refills | Status: AC | PRN

## 2018-02-16 MED ORDER — OXYCODONE HCL 5 MG PO TABS: 5.0000 mg | ORAL_TABLET | Freq: Four times a day (QID) | ORAL | 0 refills | Status: AC | PRN

## 2018-02-16 NOTE — Plan of Care (Signed)

## 2018-02-16 NOTE — Progress Notes (Signed)
Patient alert and oriented, pain is controlled. Patient is tolerating fluids, advanced to protein shake today, patient is tolerating well. Reviewed Gastric Bypass discharge instructions with patient and patient is able to articulate understanding. Provided information on BELT program, Support Group and [facility] outpatient pharmacy. All questions answered, will continue to monitor.   Total fluid intake 840 Per dehydration protocol call back one week post op 

## 2018-02-16 NOTE — Progress Notes (Signed)
Patient ID: Chelsea Mitchell, female   DOB: [birth year], 47 y.o.   MRN: [id number]   Progress Note: Metabolic and Bariatric Surgery Service   Chief Complaint/Subjective: Doing well. Min pain. No n/v. Ambulated several times Did well with water Had some minor cramps with shake this am  Objective: Vital signs in last 24 hours: Temp:  [98 F (36.7 C)-99.1 F (37.3 C)] 98 F (36.7 C) (06/26 0545) Pulse Rate:  [68-98] 72 (06/26 0545) Resp:  [14-18] 18 (06/26 0545) BP: (110-145)/(63-87) 118/70 (06/26 0545) SpO2:  [94 %-100 %] 100 % (06/26 0545) Weight:  [117.5 kg (259 lb 0.7 oz)] 117.5 kg (259 lb 0.7 oz) (06/26 0627) Last BM Date: 02/14/18  Intake/Output from previous day: 06/25 0701 - 06/26 0700 In: 3737.5 [P.O.:420; I.V.:3212.5; IV Piggyback:105] Out: 4480 [Urine:4450; Blood:30] Intake/Output this shift: No intake/output data recorded.  Lungs: cta, symm  Cardiovascular: reg  Abd: soft, obese, incisions ok. Mild approp TTP  Extremities: no edema, +SCDs  Neuro: nonfocal, alert,  Lab Results: CBC  Recent Labs    02/15/18 1027 02/16/18 0426  WBC  --  10.5  HGB 13.5 13.0  HCT 40.9 39.5  PLT  --  245   BMET Recent Labs    02/16/18 0426  NA 141  K 3.9  CL 106  CO2 25  GLUCOSE 138*  BUN 8  CREATININE 0.58  CALCIUM 8.7*   Hepatic Function Latest Ref Rng & Units 02/16/2018 02/10/2018 12/06/2017  Total Protein 6.5 - 8.1 g/dL 6.8 7.3 8.3(H)  Albumin 3.5 - 5.0 g/dL 3.5 3.8 4.1  AST 15 - 41 U/L 83(H) 57(H) 41  ALT 0 - 44 U/L 86(H) 63(H) 50  Alk Phosphatase 38 - 126 U/L 84 92 96  Total Bilirubin 0.3 - 1.2 mg/dL 0.5 0.2(L) 0.5    PT/INR No results for input(s): LABPROT, INR in the last 72 hours. ABG No results for input(s): PHART, HCO3 in the last 72 hours.  Invalid input(s): PCO2, PO2  Studies/Results:  Anti-infectives: Anti-infectives (From admission, onward)   Start     Dose/Rate Route Frequency Ordered Stop   02/15/18 0600  cefoTEtan (CEFOTAN) 2 g in sodium  chloride 0.9 % 100 mL IVPB     2 g 200 mL/hr over 30 Minutes Intravenous On call to O.R. 02/15/18 0548 02/15/18 0741      Medications: Scheduled Meds: . acetaminophen (TYLENOL) oral liquid 160 mg/5 mL  650 mg Oral Q6H  . enoxaparin (LOVENOX) injection  30 mg Subcutaneous Q12H  . gabapentin  200 mg Oral Q12H  . insulin aspart  0-15 Units Subcutaneous Q4H  . protein supplement shake  2 oz Oral Q2H   Continuous Infusions: . 0.45 % NaCl with KCl 20 mEq / L 1,000 mL (02/16/18 0551)  . famotidine (PEPCID) IV Stopped (02/15/18 2158)   PRN Meds:.diphenhydrAMINE, morphine injection, ondansetron (ZOFRAN) IV, oxyCODONE, promethazine, simethicone  Assessment/Plan: Patient Active Problem List   Diagnosis Date Noted  . Severe obesity (BMI >= 40) ([place]) 02/15/2018  . Diabetes mellitus type 2 in obese ([place]) 02/15/2018  . GERD (gastroesophageal reflux disease) 02/15/2018  . Fatty liver 02/15/2018  . Morbid obesity ([place]) 02/15/2018   s/p Procedure(s): LAPAROSCOPIC ROUX-EN-Y GASTRIC BYPASS WITH UPPER ENDOSCOPY UPPER GI ENDOSCOPY 02/15/2018 Principal Problem:   Severe obesity (BMI >= 40) (HCC) Active Problems:   Diabetes mellitus type 2 in obese (HCC)   GERD (gastroesophageal reflux disease)   Fatty liver   Morbid obesity ([place])  Doing well. No fever. No  tachycardia. Cont diet as tolerated Cont chemical vte prophylaxis Ambulate, pulm toilet Discussed operative findings.  i'm out rest of week so I have asked Dr Excell Seltzer to cover pt  Disposition:  LOS: 1 day  The patient will be in the hospital for normal postop protocol  Greer Pickerel, MD [phone] [facility]

## 2018-02-16 NOTE — Progress Notes (Signed)
Patient alert and oriented, Post op day 1.  Provided support and encouragement.  Encouraged pulmonary toilet, ambulation and small sips of liquids.  Completed 12 ounces of bari clear fluids and 2 ounces of protein shake. All questions answered.  Will continue to monitor. 

## 2018-02-16 NOTE — Discharge Summary (Signed)
Physician Discharge Summary  Chelsea Mitchell:[id number] DOB: 1970-10-14 DOA: 02/15/2018  PCP: Leola Brazil, DO  Admit date: 02/15/2018 Discharge date: 02/16/2018  Recommendations for Outpatient Follow-up:    Follow-up Information    Gaynelle Adu, MD. Go on 03/17/2018.   Specialty:  General Surgery Why:  at 2.  Please arrive 15 minutes early for your appointment Contact information: [address] [place] [address] [phone]        Gaynelle Adu, MD .   Specialty:  General Surgery Contact information: [address] [place] [address] [phone]          Discharge Diagnoses:  Principal Problem:   Severe obesity (BMI >= 40) (HCC) Active Problems:   Diabetes mellitus type 2 in obese (HCC)   GERD (gastroesophageal reflux disease)   Fatty liver   Morbid obesity (HCC)   Surgical Procedure: Laparoscopic Roux-en-Y gastric bypass, upper endoscopy  Discharge Condition: Good Disposition: Home  Diet recommendation: Postoperative gastric bypass diet  Filed Weights   02/15/18 0609 02/16/18 0627  Weight: 116.5 kg (256 lb 12.8 oz) 117.5 kg (259 lb 0.7 oz)     Hospital Course:  The patient was admitted for a planned laparoscopic Roux-en-Y gastric bypass. Please see operative note. Preoperatively the patient was given 5000 units of subcutaneous heparin for DVT prophylaxis. ERAS protocol was used. Postoperative prophylactic Lovenox dosing was started on the evening of postoperative day 0.  The patient was started on ice chips and water on the evening of POD 0 which they tolerated. On postoperative day 1 The patient's diet was advanced to protein shakes which they also tolerated. On POD 1, The patient was ambulating without difficulty. Their vital signs are stable without fever or tachycardia. Their hemoglobin had remained stable.  The patient had received discharge instructions and counseling. They were deemed stable for discharge.  BP  120/70 (BP Location: Left Arm)   Pulse 69   Temp 98 F (36.7 C) (Oral)   Resp 13   Ht 5\' 7"  (1.702 m)   Wt 117.5 kg (259 lb 0.7 oz)   SpO2 97%   BMI 40.57 kg/m    Discharge Instructions  Discharge Instructions    Ambulate hourly while awake   Complete by:  As directed    Call MD for:  difficulty breathing, headache or visual disturbances   Complete by:  As directed    Call MD for:  persistant dizziness or light-headedness   Complete by:  As directed    Call MD for:  persistant nausea and vomiting   Complete by:  As directed    Call MD for:  redness, tenderness, or signs of infection (pain, swelling, redness, odor or green/yellow discharge around incision site)   Complete by:  As directed    Call MD for:  severe uncontrolled pain   Complete by:  As directed    Call MD for:  temperature >101 F   Complete by:  As directed    Diet bariatric full liquid   Complete by:  As directed    Discharge instructions   Complete by:  As directed    See bariatric discharge instructions   Incentive spirometry   Complete by:  As directed    Perform hourly while awake     Allergies as of 02/16/2018      Reactions   Codeine    Nausea on occasion      Medication List    STOP  taking these medications   metFORMIN 500 MG tablet Commonly known as:  GLUCOPHAGE     TAKE these medications   cetirizine 10 MG tablet Commonly known as:  ZYRTEC Take 10 mg by mouth at bedtime.   ondansetron 4 MG disintegrating tablet Commonly known as:  ZOFRAN-ODT Take 1 tablet (4 mg total) by mouth every 6 (six) hours as needed for nausea or vomiting.   oxyCODONE 5 MG immediate release tablet Commonly known as:  Oxy IR/ROXICODONE Take 1 tablet (5 mg total) by mouth every 6 (six) hours as needed for severe pain or breakthrough pain.   pantoprazole 40 MG tablet Commonly known as:  PROTONIX Take 40 mg by mouth 2 (two) times daily.   sertraline 100 MG tablet Commonly known as:  ZOLOFT Take 100 mg by  mouth at bedtime.      Follow-up Information    Gaynelle AduWilson, Jaymison Luber, MD. Go on 03/17/2018.   Specialty:  General Surgery Why:  at 2.  Please arrive 15 minutes early for your appointment Contact information: [address] [place] [address] [phone]        Gaynelle AduWilson, Rumaisa Schnetzer, MD .   Specialty:  General Surgery Contact information: [address] [place] [address] [phone]            The results of significant diagnostics from this hospitalization (including imaging, microbiology, ancillary and laboratory) are listed below for reference.    Significant Diagnostic Studies: No results found.  Labs: Basic Metabolic Panel: Recent Labs  Lab 02/10/18 1402 02/16/18 0426  NA 141 141  K 4.3 3.9  CL 109 106  CO2 24 25  GLUCOSE 162* 138*  BUN 10 8  CREATININE 0.56 0.58  CALCIUM 9.4 8.7*   Liver Function Tests: Recent Labs  Lab 02/10/18 1402 02/16/18 0426  AST 57* 83*  ALT 63* 86*  ALKPHOS 92 84  BILITOT 0.2* 0.5  PROT 7.3 6.8  ALBUMIN 3.8 3.5    CBC: Recent Labs  Lab 02/10/18 1402 02/15/18 1027 02/16/18 0426  WBC 7.8  --  10.5  NEUTROABS 3.7  --  6.2  HGB 13.7 13.5 13.0  HCT 41.1 40.9 39.5  MCV 89.3  --  90.6  PLT 249  --  245    CBG: Recent Labs  Lab 02/15/18 1958 02/15/18 2349 02/16/18 0409 02/16/18 0755 02/16/18 1123  GLUCAP 166* 148* 131* 124* 160*    Principal Problem:   Severe obesity (BMI >= 40) (HCC) Active Problems:   Diabetes mellitus type 2 in obese (HCC)   GERD (gastroesophageal reflux disease)   Fatty liver   Morbid obesity (HCC)   Time coordinating discharge: 10 min  Signed:  Atilano InaEric M Kimbrely Buckel, MD [facility], [place] [phone] 02/16/2018, 2:55 PM

## 2018-02-16 NOTE — Progress Notes (Signed)
Pt was discharged home today. Instructions were reviewed with patient, and questions were answered. Pt was taken to main entrance via wheelchair by NT.  

## 2018-02-21 ENCOUNTER — Telehealth (HOSPITAL_COMMUNITY): Payer: Self-pay

## 2018-02-21 NOTE — Telephone Encounter (Signed)
Patient called to discuss post bariatric surgery follow up questions.  See below:   1.  Tell me about your pain and pain management?no pain  2.  Let's talk about fluid intake.  How much total fluid are you taking in?46 ouncesof fluid  3.  How much protein have you taken in the last 2 days?40 grams of protein  4.  Have you had nausea?  Tell me about when have experienced nausea and what you did to help?no nausea but having heartburn taking protonix, called office for advice  5.  Has the frequency or color changed with your urine?urinating frequently color of apple juice, discussed increasing fluid daily and calling [facility] if urine decreases or become darker  6.  Tell me what your incisions look like?looks good steri strips are coming off  7.  Have you been passing gas? BM?passing had bm today after miralax  8.  If a problem or question were to arise who would you call?  Do you know contact numbers for [facility], [facility], and [facility]?is aware of how to cotnact all agencies  9.  How has the walking going?ambulating regularly  10.  How are your vitamins and calcium going?  How are you taking them?MVI opurity taking once per day crushing to tolerate and Viactic chews for calcium

## 2018-03-01 ENCOUNTER — Encounter: Payer: BC Managed Care – PPO | Attending: General Surgery | Admitting: Registered"

## 2018-03-01 DIAGNOSIS — E119 Type 2 diabetes mellitus without complications: Secondary | ICD-10-CM

## 2018-03-01 DIAGNOSIS — Z6841 Body Mass Index (BMI) 40.0 and over, adult: Secondary | ICD-10-CM | POA: Diagnosis not present

## 2018-03-01 DIAGNOSIS — Z713 Dietary counseling and surveillance: Secondary | ICD-10-CM | POA: Diagnosis not present

## 2018-03-02 NOTE — Progress Notes (Signed)
Bariatric Class:  Appt start time: 1530 end time:  1630.  2 Week Post-Operative Nutrition Class  Patient was seen on 03/01/2018 for Post-Operative Nutrition education at [facility].   Surgery date: 02/15/2018 Surgery type: RYGB Start weight at [facility]: 260.2 Weight today: 241.2 Weight change: 20   Pt states she is drinking 64-80 ounces of fluid a day. Pt states she has stopped taking metformin. Pt states she experiences some gas. Pt states she checks BS once/day: FBS (105).   TANITA  BODY COMP RESULTS  03/01/2018   BMI (kg/m^2) 38.9   Fat Mass (lbs) 127.6   Fat Free Mass (lbs) 113.6   Total Body Water (lbs) 83.4   The following the learning objectives were met by the patient during this course:  Identifies Phase 3A (Soft, High Proteins) Dietary Goals and will begin from 2 weeks post-operatively to 2 months post-operatively  Identifies appropriate sources of fluids and proteins   States protein recommendations and appropriate sources post-operatively  Identifies the need for appropriate texture modifications, mastication, and bite sizes when consuming solids  Identifies appropriate multivitamin and calcium sources post-operatively  Describes the need for physical activity post-operatively and will follow MD recommendations  States when to call healthcare provider regarding medication questions or post-operative complications  Handouts given during class include:  Phase 3A: Soft, High Protein Diet Handout  Follow-Up Plan: Patient will follow-up at [facility] in 6 weeks for 2 month post-op nutrition visit for diet advancement per MD.

## 2018-03-09 ENCOUNTER — Telehealth: Payer: Self-pay | Admitting: Registered"

## 2018-03-09 NOTE — Telephone Encounter (Signed)
RD called pt to verify fluid intake once starting soft, solid proteins 2 week post-bariatric surgery.   Daily Fluid intake: ~48 oz Daily Protein intake: 60+ grams  Concerns/issues: Pt states eggs and cheese, deli meat and cheese, as well as ricotta bake caused her to feel uncomfortable. RD advised pt to take things slow, eating protein foods individually first then gradually combining items.

## 2018-04-12 ENCOUNTER — Encounter: Payer: BC Managed Care – PPO | Attending: General Surgery | Admitting: Skilled Nursing Facility1

## 2018-04-12 ENCOUNTER — Encounter: Payer: Self-pay | Admitting: Skilled Nursing Facility1

## 2018-04-12 DIAGNOSIS — Z6841 Body Mass Index (BMI) 40.0 and over, adult: Secondary | ICD-10-CM | POA: Diagnosis not present

## 2018-04-12 DIAGNOSIS — Z713 Dietary counseling and surveillance: Secondary | ICD-10-CM | POA: Insufficient documentation

## 2018-04-12 DIAGNOSIS — E669 Obesity, unspecified: Secondary | ICD-10-CM

## 2018-04-12 DIAGNOSIS — E1169 Type 2 diabetes mellitus with other specified complication: Secondary | ICD-10-CM

## 2018-04-12 NOTE — Progress Notes (Signed)
Follow-up visit:  8 Weeks Post-Operative RYGB Surgery  Primary concerns today: Post-operative Bariatric Surgery Nutrition Management.   Pt states she has been eating the same things over and over because she had a bit of a rough transition from liquid to solid. Pt states she has been taking miralax for about 1 year.   Surgery date: 02/15/2018 Surgery type: [facility]YGB [facility] Weight today: 222.2 Weight change: 19    TANITA  BODY COMP RESULTS  03/01/2018 04/12/2018   BMI (kg/m^2) 38.9 35.9   Fat Mass (lbs) 127.6 107.6   Fat Free Mass (lbs) 113.6 114.6   Total Body Water (lbs) 83.4 83.2   24-hr recall: B (AM): yogurt with protein powder Snk (AM):  L (PM): cheese stick and [place] stick or protein bar  Snk (PM):  D (PM): shrimp Snk (PM):   Fluid intake: 50 ounces water, 1 cup of decaff coffee  Estimated total protein intake: 60+  Medications: see list  Supplementation: opurity and calcium   CBG monitoring: stopped checking Average CBG per patient:  Last patient reported A1c: 5.8  Using straws: no Drinking while eating: no Having you been chewing well:no Chewing/swallowing difficulties: no Changes in vision: no Changes to mood/headaches: no Hair loss/Cahnges to skin/Changes to nails: no Any difficulty focusing or concentrating: no Sweating: no Dizziness/Lightheaded: no Palpitations: no  Carbonated beverages: no N/V/D/C/GAS: taking miralax every day Abdominal Pain: no Dumping syndrome: no  Recent physical activity:  4 days a week 30 minutes   Progress Towards Goal(s):  In progress.  Handouts given during visit include:  Non starchy veggies + protein   Nutritional Diagnosis:  [place]-3.3 Overweight/obesity related to past poor dietary habits and physical inactivity as evidenced by patient w/ recent RYGB surgery following dietary guidelines for continued weight loss.  Intervention:  Nutrition counseling. Dietitian educated the pt on advancing her  diet. Goals: -Start out with soft cooked for 2 days and then try raw if all goes well -Aim to have non-starchy veggies 2 times a day 7 days a week -Aim to eat beans 4 times a week -Aim to have 64 fluid ounces a day -Try Kefir  Teaching Method Utilized:  Visual Auditory Hands on  Barriers to learning/adherence to lifestyle change: none stated   Demonstrated degree of understanding via:  Teach Back   Monitoring/Evaluation:  Dietary intake, exercise, and body weight.

## 2018-07-13 ENCOUNTER — Encounter: Payer: BC Managed Care – PPO | Attending: General Surgery | Admitting: Skilled Nursing Facility1

## 2018-07-13 ENCOUNTER — Encounter: Payer: Self-pay | Admitting: Skilled Nursing Facility1

## 2018-07-13 DIAGNOSIS — E669 Obesity, unspecified: Secondary | ICD-10-CM

## 2018-07-13 DIAGNOSIS — E1169 Type 2 diabetes mellitus with other specified complication: Secondary | ICD-10-CM

## 2018-07-13 NOTE — Progress Notes (Signed)
Follow-up visit: Post-Operative RYGB Surgery  Primary concerns today: Post-operative Bariatric Surgery Nutrition Management.  Pt states she did not like kefir. Pt states since eating beans more often her bowel movements have gotten better and only takes the miralax every so often. Pt would like to add in fruit.   Surgery date: 02/15/2018 Surgery type[facility]GB [facility] Weight today: 183.8 Weight change: 38.4  TANITA  BODY COMP RESULTS  03/01/2018 04/12/2018 07/13/2018   BMI (kg/m^2) 38.9 35.9 29.7   Fat Mass (lbs) 127.6 107.6 77.6   Fat Free Mass (lbs) 113.6 114.6 106.2   Total Body Water (lbs) 83.4 83.2 75.8   24-hr recall: B (AM): yogurt with protein powder so,times with sausage  Snk (AM):  L (PM): 3 ounces stew beef or meatloaf or chicken with a  Few bites of vegetables  Snk (PM):  D (PM): 3 ounces stew beef or meatloaf or chicken with a  Few bites of vegetables  Snk (PM):   Fluid intake: 67.6 ounces water, 1 cup of regular coffee  Estimated total protein intake: 60+  Medications: see list  Supplementation: opurity and calcium   CBG monitoring: stopped checking Average CBG per patient:  Last patient reported A1c: 5.7  Using straws: no Drinking while eating: no Having you been chewing well:no Chewing/swallowing difficulties: no Changes in vision: no Changes to mood/headaches: no Hair loss/Cahnges to skin/Changes to nails: no Any difficulty focusing or concentrating: no Sweating: no Dizziness/Lightheaded: no Palpitations: no  Carbonated beverages: no N/V/D/C/GAS: taking miralax sometimes throughout the week having a bowel movement every day  Abdominal Pain: no Dumping syndrome: no  Recent physical activity:  ADL's  Progress Towards Goal(s):  In progress.  Handouts given during visit include:  Non starchy veggies + protein   Nutritional Diagnosis:  [place]-3.3 Overweight/obesity related to past poor dietary habits and physical inactivity as  evidenced by patient w/ recent RYGB surgery following dietary guidelines for continued weight loss.  Intervention:  Nutrition counseling. Dietitian educated the pt on advancing her diet. Goals: -Aim to have 1 snack of non starchy vegetable and 1 snack of fruit (half a banana, 1 clementine, 10 grapes, etc.) -Aim for 2 days a week of 30 minutes on treadmill  Teaching Method Utilized:  Visual Auditory Hands on  Barriers to learning/adherence to lifestyle change: none stated   Demonstrated degree of understanding via:  Teach Back   Monitoring/Evaluation:  Dietary intake, exercise, and body weight.

## 2018-07-13 NOTE — Patient Instructions (Addendum)
-  Aim to have 1 snack of non starchy vegetable and 1 snack of fruit (half a banana, 1 clementine, 10 grapes, etc.)  -Aim for 2 days a week of 30 minutes on treadmill

## 2018-09-12 ENCOUNTER — Ambulatory Visit: Payer: BC Managed Care – PPO | Admitting: Skilled Nursing Facility1

## 2018-09-14 ENCOUNTER — Encounter: Payer: Self-pay | Admitting: Skilled Nursing Facility1

## 2018-09-14 ENCOUNTER — Encounter: Payer: BC Managed Care – PPO | Attending: General Surgery | Admitting: Skilled Nursing Facility1

## 2018-09-14 DIAGNOSIS — E1169 Type 2 diabetes mellitus with other specified complication: Secondary | ICD-10-CM

## 2018-09-14 DIAGNOSIS — E669 Obesity, unspecified: Secondary | ICD-10-CM

## 2018-09-14 NOTE — Progress Notes (Signed)
Follow-up visit: Post-Operative RYGB Surgery  Primary concerns today: Post-operative Bariatric Surgery Nutrition Management.  Pt states her energy level is so-so.  Pt states she had started thinning in her hair before surgery stating she feels it has gotten worse. Pt was wearing a wig but did show dietitian a picture showing substantial thinning to balding. Pt does not seem to have any other symptoms of zinc or iron deficiency other than hair loss and lower energy however pt was wearing acrylics on her nails. Pt states she has not been under any substantial stress and hair loss does not run in her family.  Dietitian advised pt to let her surgeon know she has had substantial thinning having started before surgery.   Surgery date: 02/15/2018 Surgery type: RYGB Start weight at [facility]: 260.2 Weight today: 166.2 Weight change: 17.6  TANITA  BODY COMP RESULTS  03/01/2018 04/12/2018 07/13/2018 09/14/2018   BMI (kg/m^2) 38.9 35.9 29.7 26   Fat Mass (lbs) 127.6 107.6 77.6 67.6   Fat Free Mass (lbs) 113.6 114.6 106.2 98.6   Total Body Water (lbs) 83.4 83.2 75.8 69.6   24-hr recall: B (AM): yogurt with protein powder so,times with sausage coffee with muscle milk and greek yogurt sometimes with fruit or meat Snk (AM):  L (PM): 3 grilled chicken and few bites of vegetables  Snk (PM): fruit  D (PM): 3 ounces grilled chicken and few bites of vegetables  Snk (PM):   Fluid intake: 64 ounces water, 1 cup of regular coffee  Estimated total protein intake: 60+  Medications: see list  Supplementation: opurity and calcium   CBG monitoring: stopped checking  Average CBG per patient:  Last patient reported A1c: 5.7  Using straws: no Drinking while eating: no Having you been chewing well:no Chewing/swallowing difficulties: no Changes in vision: no Changes to mood/headaches: no Hair loss/Cahnges to skin/Changes to nails: no Any difficulty focusing or concentrating: no Sweating:  no Dizziness/Lightheaded: no Palpitations: no  Carbonated beverages: no N/V/D/C/GAS: taking miralax sometimes throughout the week having a bowel movement every day  Abdominal Pain: no Dumping syndrome: no  Recent physical activity:  2 days week 30 minutes   Progress Towards Goal(s):  In progress.  Handouts given during visit include:  Non starchy veggies + protein   Nutritional Diagnosis:  [place]-3.3 Overweight/obesity related to past poor dietary habits and physical inactivity as evidenced by patient w/ recent RYGB surgery following dietary guidelines for continued weight loss.  Intervention:  Nutrition counseling. Dietitian educated the pt on advancing her diet. Goals: -Increase your workouts to 3 days of cardio and 1 day of resistance (10-15 minutes) -Keep having at least one snack of fruit and add in 1 snack of vegetable  -Always add fruit to your yogurt -Have a non starchy vegetable 3 times a week: potatoes, corn, peas -Aim to eat every 3 hours  -Aim to drink at least half a bottle of water while walking   Teaching Method Utilized:  Visual Auditory Hands on  Barriers to learning/adherence to lifestyle change: none stated   Demonstrated degree of understanding via:  Teach Back   Monitoring/Evaluation:  Dietary intake, exercise, and body weight.

## 2018-09-14 NOTE — Patient Instructions (Addendum)
-  Increase your workouts to 3 days of cardio and 1 day of resistance (10-15 minutes)  -Keep having at least one snack of fruit and add in 1 snack of vegetable   -Always add fruit to your yogurt  -Have a non starchy vegetable 3 times a week: potatoes, corn, peas  -Aim to eat every 3 hours   -Aim to drink at least half a bottle of water while walking

## 2018-11-16 ENCOUNTER — Ambulatory Visit: Payer: BC Managed Care – PPO | Admitting: Skilled Nursing Facility1

## 2019-08-21 ENCOUNTER — Encounter (HOSPITAL_COMMUNITY): Payer: Self-pay

## 2020-03-21 IMAGING — RF DG UGI W/ KUB
9 of 14 series · 12 of 24 positions shown · non-contrast
Comparison: None.

CLINICAL DATA: Pre gastric bypass

EXAM:
UPPER GI SERIES WITH KUB
TECHNIQUE: After obtaining a scout radiograph a routine upper GI series was
performed using thin barium
FLUOROSCOPY TIME:  Fluoroscopy Time:  1.6 minutes
Radiation Exposure Index (if provided by the fluoroscopic device):
41.6 mGy
Number of Acquired Spot Images: 0

[Series 2: cp_standard · 0.51mm/px · 1 of 47 frames shown (1 of 9)]
[frame 8/47]
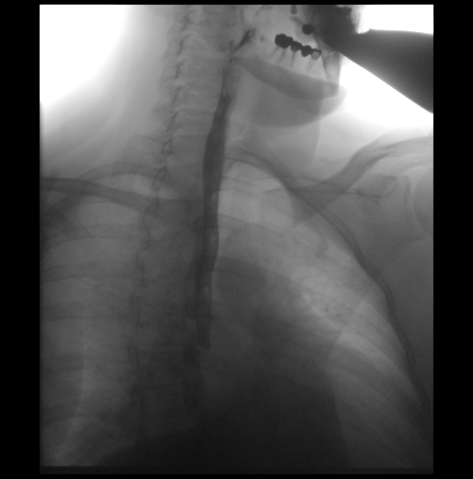

[Series 3: cp_standard · 0.51mm/px · 2 of 52 frames shown (2 of 9)]
[frame 8/52]
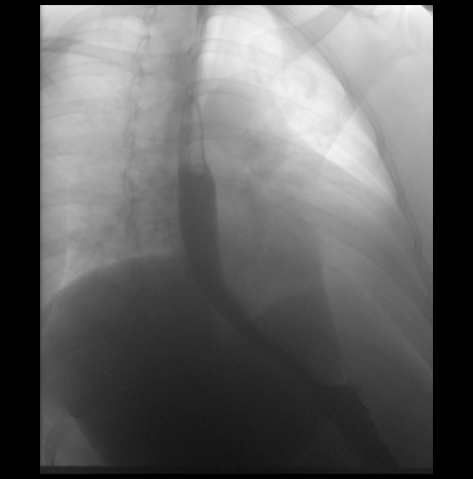
[frame 45/52]
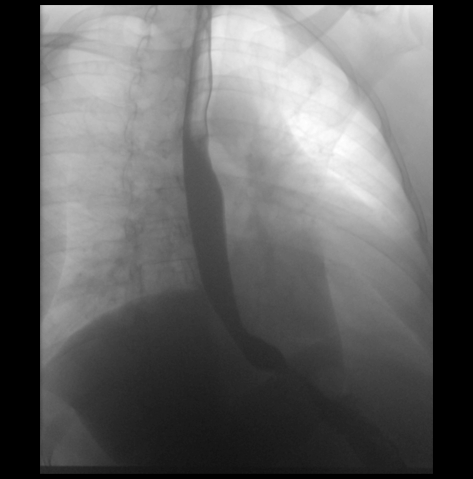

[Series 5: cp_standard · 0.52mm/px · 1 of 96 frames shown (3 of 9)]
[frame 15/96]
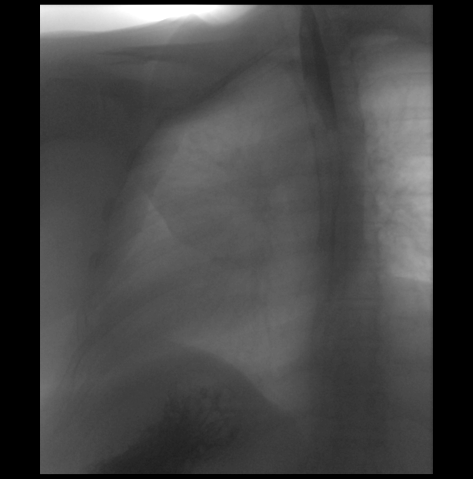

[Series 6: cp_standard · 0.52mm/px · 1 of 64 frames shown (4 of 9)]
[frame 10/64]
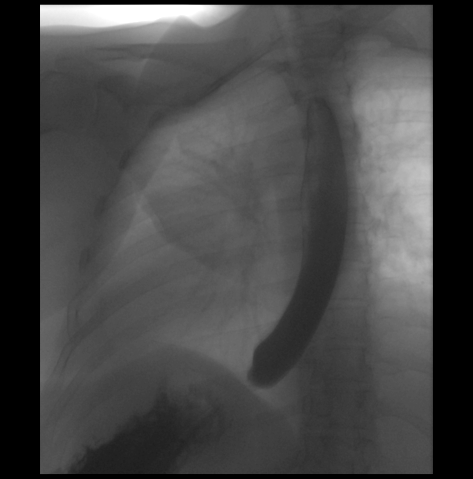

[Series 7: cp_standard · 0.52mm/px · 2 of 63 frames shown (5 of 9)]
[frame 10/63]
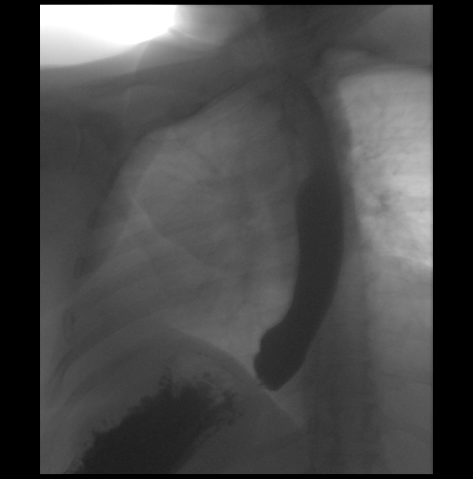
[frame 54/63]
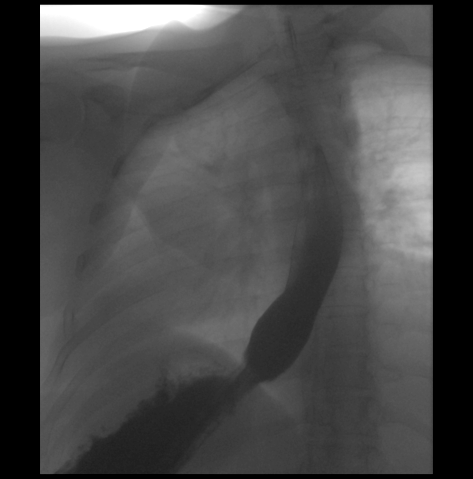

[Series 8: cp_standard · 0.52mm/px · 1 of 30 frames shown (6 of 9)]
[frame 16/30]
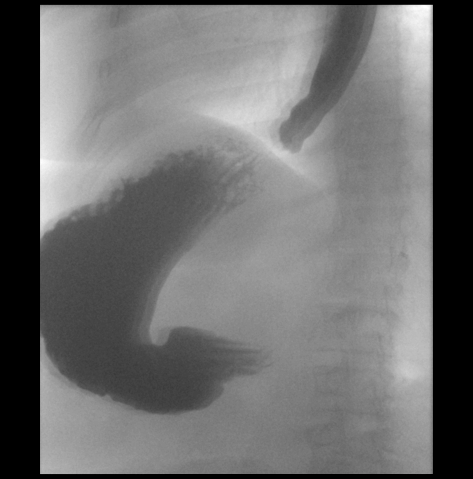

[Series 11: cp_standard · 0.26mm/px · 1 of 1 slices shown (7 of 9)]
[im 1/1]
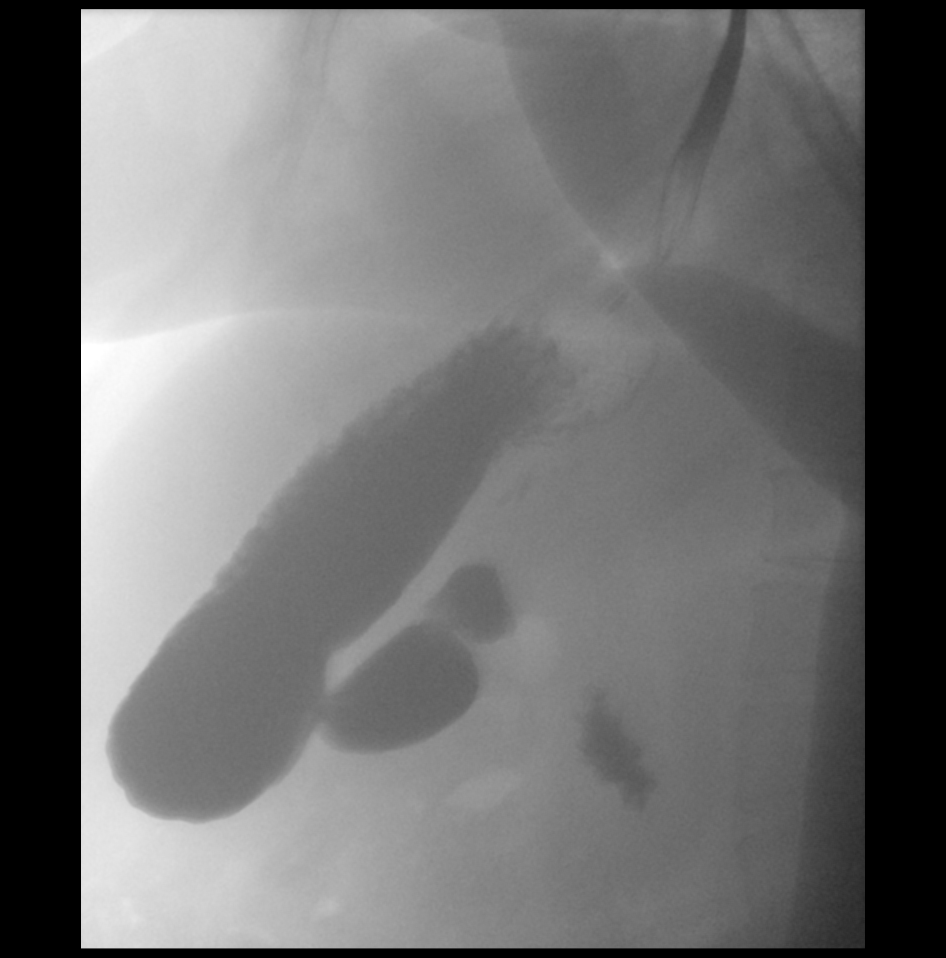

[Series 14: cp_standard · 0.26mm/px · 1 of 1 slices shown (8 of 9)]
[im 1/1]
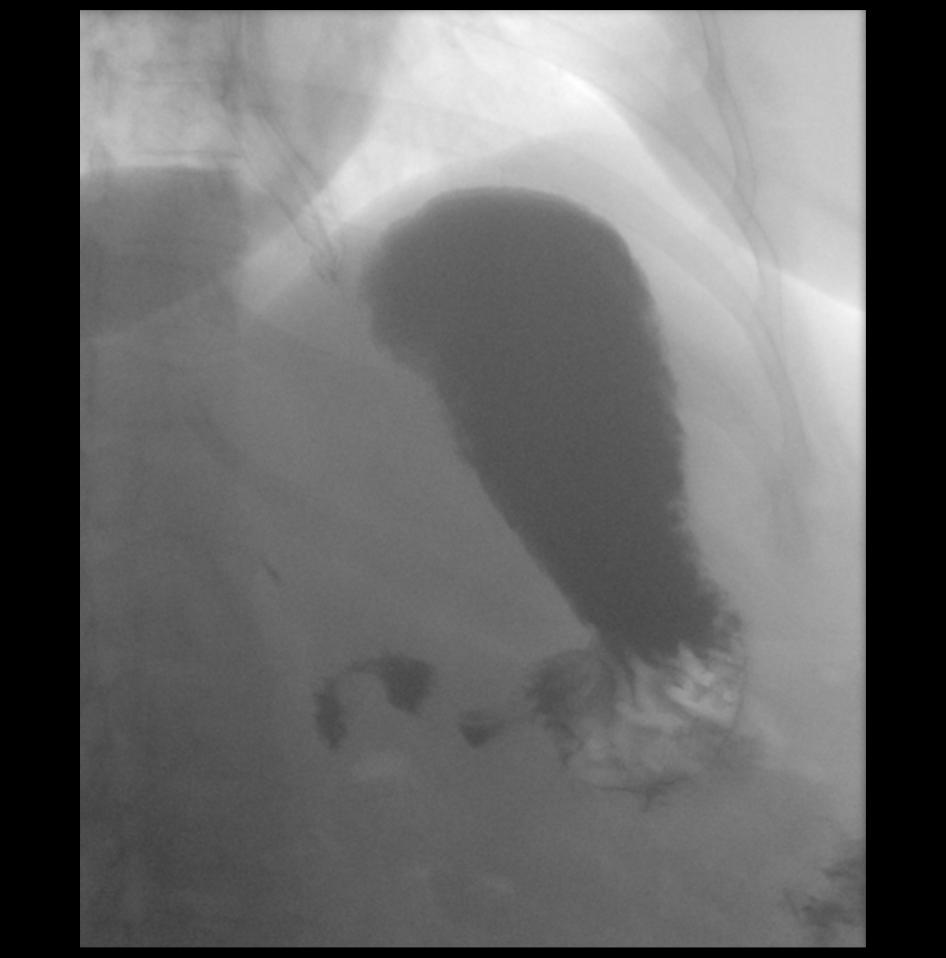

[Series 17: cp_standard · 0.52mm/px · 2 of 47 frames shown (9 of 9)]
[frame 8/47]
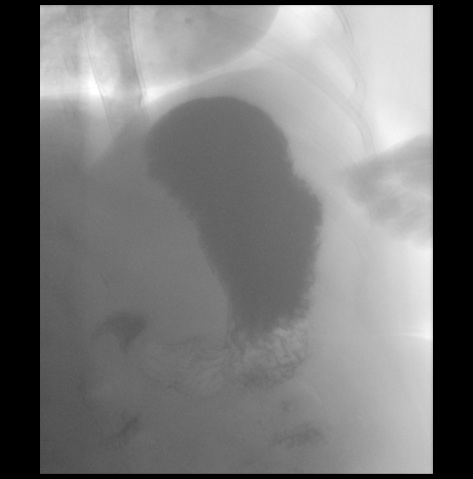
[frame 47/47]
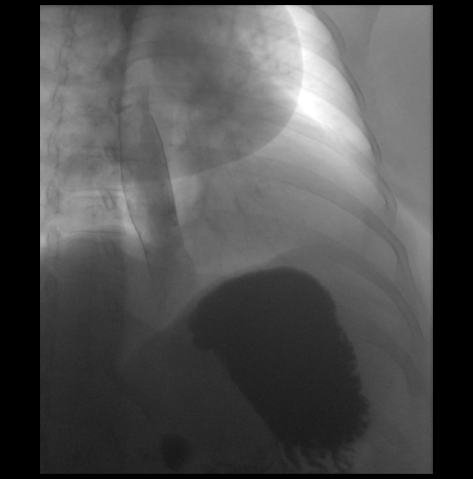

[12 of 24 positions shown; findings below may reference images not displayed]

FINDINGS: Scout image demonstrates changes of cholecystectomy. Normal bowel
gas pattern. No organomegaly or suspicious calcification.

Fluoroscopic evaluation of swallowing demonstrates normal esophageal
peristalsis. No fixed stricture, fold thickening or mass.

Stomach, duodenal bulb and duodenal sweep are unremarkable. No
ulceration, mass or fold thickening. No reflux with the water siphon
maneuver.
IMPRESSION: Unremarkable upper GI.

## 2024-05-12 ENCOUNTER — Other Ambulatory Visit: Payer: Self-pay | Admitting: Medical Genetics

## 2024-08-03 ENCOUNTER — Other Ambulatory Visit (HOSPITAL_COMMUNITY)
Admission: RE | Admit: 2024-08-03 | Discharge: 2024-08-03 | Disposition: A | Discharge status: Home / Self Care | Payer: Self-pay | Source: Ambulatory Visit | Attending: Medical Genetics | Admitting: Medical Genetics

## 2024-08-15 LAB — GENECONNECT MOLECULAR SCREEN: Genetic Analysis Overall Interpretation: NEGATIVE
